# Patient Record
Sex: Female | Born: 1965 | Race: Black or African American | Hispanic: No | Marital: Married | Smoking: Never smoker
Health system: Southern US, Community
[De-identification: ages and names within clinical notes are randomized; demographics above are authoritative.]

## PROBLEM LIST (undated history)

## (undated) DIAGNOSIS — R87619 Unspecified abnormal cytological findings in specimens from cervix uteri: Secondary | ICD-10-CM

## (undated) DIAGNOSIS — D573 Sickle-cell trait: Secondary | ICD-10-CM

## (undated) DIAGNOSIS — M5481 Occipital neuralgia: Secondary | ICD-10-CM

## (undated) DIAGNOSIS — G43909 Migraine, unspecified, not intractable, without status migrainosus: Secondary | ICD-10-CM

## (undated) HISTORY — DX: Occipital neuralgia: M54.81

## (undated) HISTORY — PX: TUBAL LIGATION: SHX77

## (undated) HISTORY — DX: Sickle-cell trait: D57.3

## (undated) HISTORY — PX: CRYOTHERAPY: SHX1416

## (undated) HISTORY — DX: Unspecified abnormal cytological findings in specimens from cervix uteri: R87.619

---

## 1998-02-17 ENCOUNTER — Emergency Department (HOSPITAL_COMMUNITY): Admission: EM | Admit: 1998-02-17 | Discharge: 1998-02-17 | Payer: Self-pay | Admitting: Emergency Medicine

## 1998-06-05 ENCOUNTER — Emergency Department (HOSPITAL_COMMUNITY): Admission: EM | Admit: 1998-06-05 | Discharge: 1998-06-05 | Payer: Self-pay | Admitting: Emergency Medicine

## 1998-07-23 ENCOUNTER — Ambulatory Visit (HOSPITAL_COMMUNITY): Admission: RE | Admit: 1998-07-23 | Discharge: 1998-07-23 | Payer: Self-pay

## 1998-09-14 ENCOUNTER — Encounter: Payer: Self-pay | Admitting: Emergency Medicine

## 1998-09-14 ENCOUNTER — Emergency Department (HOSPITAL_COMMUNITY): Admission: EM | Admit: 1998-09-14 | Discharge: 1998-09-14 | Payer: Self-pay | Admitting: Emergency Medicine

## 1998-10-06 ENCOUNTER — Emergency Department (HOSPITAL_COMMUNITY): Admission: EM | Admit: 1998-10-06 | Discharge: 1998-10-06 | Payer: Self-pay | Admitting: Emergency Medicine

## 1998-10-06 ENCOUNTER — Ambulatory Visit (HOSPITAL_COMMUNITY): Admission: RE | Admit: 1998-10-06 | Discharge: 1998-10-06 | Payer: Self-pay | Admitting: Family Medicine

## 1998-10-06 ENCOUNTER — Encounter: Payer: Self-pay | Admitting: Family Medicine

## 2001-09-01 ENCOUNTER — Emergency Department (HOSPITAL_COMMUNITY): Admission: EM | Admit: 2001-09-01 | Discharge: 2001-09-01 | Payer: Self-pay | Admitting: Emergency Medicine

## 2002-04-23 ENCOUNTER — Emergency Department (HOSPITAL_COMMUNITY): Admission: EM | Admit: 2002-04-23 | Discharge: 2002-04-23 | Payer: Self-pay | Admitting: *Deleted

## 2002-06-05 ENCOUNTER — Emergency Department (HOSPITAL_COMMUNITY): Admission: EM | Admit: 2002-06-05 | Discharge: 2002-06-05 | Payer: Self-pay | Admitting: Emergency Medicine

## 2004-11-20 ENCOUNTER — Emergency Department (HOSPITAL_COMMUNITY): Admission: EM | Admit: 2004-11-20 | Discharge: 2004-11-20 | Payer: Self-pay | Admitting: Emergency Medicine

## 2012-05-25 ENCOUNTER — Encounter (HOSPITAL_COMMUNITY): Payer: Self-pay | Admitting: Family Medicine

## 2012-05-25 ENCOUNTER — Emergency Department (HOSPITAL_COMMUNITY)
Admission: EM | Admit: 2012-05-25 | Discharge: 2012-05-25 | Disposition: A | Discharge status: Home / Self Care | Payer: Self-pay | Attending: Emergency Medicine | Admitting: Emergency Medicine

## 2012-05-25 DIAGNOSIS — G44009 Cluster headache syndrome, unspecified, not intractable: Secondary | ICD-10-CM | POA: Insufficient documentation

## 2012-05-25 HISTORY — DX: Migraine, unspecified, not intractable, without status migrainosus: G43.909

## 2012-05-25 MED ORDER — SUMATRIPTAN SUCCINATE 100 MG PO TABS: 100.0000 mg | ORAL_TABLET | ORAL | Status: DC | PRN

## 2012-05-25 NOTE — ED Provider Notes (Signed)
History     CSN: [id number]  Arrival date & time 05/25/12  0707   First MD Initiated Contact with Patient 05/25/12 [phone]      Chief Complaint  Patient presents with  . Migraine    (Consider location/radiation/quality/duration/timing/severity/associated sxs/prior treatment) Patient is a 46 y.o. female presenting with migraines. The history is provided by the patient.  Migraine This is a recurrent problem. Associated symptoms include headaches. Pertinent negatives include no chest pain, no abdominal pain and no shortness of breath.   patient states that she's been having episodes of headaches. States it is sharp in the back of her head at work his way to the front. She states it is made her tear. No fevers. She states she gets migraines, but this feels different. She states sometimes moving her head to certain positional make it feel better. No trouble hearing. No nausea vomiting or photosensitivity. No head trauma. No visual changes. No neck pain. She states the headaches will come and go. She states that she has one now. She's not tried any medications.  Past Medical History  Diagnosis Date  . Migraine     History reviewed. No pertinent past surgical history.  No family history on file.  History  Substance Use Topics  . Smoking status: Never Smoker   . Smokeless tobacco: Not on file  . Alcohol Use: No    OB History    Grav Para Term Preterm Abortions TAB SAB Ect Mult Living                  Review of Systems  Constitutional: Negative for chills, appetite change and fatigue.  HENT: Negative for neck pain and neck stiffness.   Eyes: Negative for pain and redness.       Tearing of right eye  Respiratory: Negative for shortness of breath.   Cardiovascular: Negative for chest pain.  Gastrointestinal: Negative for abdominal pain.  Genitourinary: Negative for flank pain.  Skin:       Patient states that she gets bumps on her scans after going in a certain room to play she  works.  Neurological: Positive for headaches.  Hematological: Negative for adenopathy.    Allergies  Review of patient's allergies indicates no known allergies.  Home Medications   Current Outpatient Rx  Name Route Sig Dispense Refill  . ASPIRIN-ACETAMINOPHEN-CAFFEINE 250-250-65 MG PO TABS Oral Take 1 tablet by mouth every 6 (six) hours as needed. migraine    . SUMATRIPTAN SUCCINATE 100 MG PO TABS Oral Take 1 tablet (100 mg total) by mouth every 2 (two) hours as needed for migraine. 10 tablet 0    BP 145/82  Pulse 70  Temp 97.8 F (36.6 C) (Oral)  Resp 18  SpO2 100%  LMP 05/25/2012  Physical Exam  Constitutional: She appears well-developed and well-nourished.  HENT:  Head: Normocephalic.       Right TM normal No tenderness to posterior head.  Eyes: Pupils are equal, round, and reactive to light.  Neck: Neck supple.  Cardiovascular: Normal rate.   Pulmonary/Chest: Effort normal.  Abdominal: Soft.  Musculoskeletal: Normal range of motion.  Neurological: She is alert.  Skin: Skin is warm.    ED Course  Procedures (including critical care time)  Labs Reviewed - No data to display No results found.   1. Cluster headache       MDM  Patient with headache and some tearing of the right eye. She's been having episodes of it is likely a cluster  headache. She feels better after his flow oxygen. She be discharged home with some Imitrex and will followup with neurology as needed.        Juliet Rude. Rubin Payor, MD 05/25/12 1609

## 2012-05-25 NOTE — ED Notes (Signed)
Patient states that she has had a migraine since yesterday. Also c/o pressure in the base of her head on the right. Has not had any meds. Denies nausea, vomiting, or photosensitivity.

## 2012-10-07 ENCOUNTER — Encounter: Payer: Self-pay | Admitting: Nurse Practitioner

## 2012-10-07 DIAGNOSIS — M5481 Occipital neuralgia: Secondary | ICD-10-CM | POA: Insufficient documentation

## 2012-11-06 ENCOUNTER — Encounter: Payer: Self-pay | Admitting: Nurse Practitioner

## 2012-11-06 ENCOUNTER — Ambulatory Visit (INDEPENDENT_AMBULATORY_CARE_PROVIDER_SITE_OTHER): Payer: BC Managed Care – PPO | Admitting: Nurse Practitioner

## 2012-11-06 VITALS — BP 108/63 | HR 82 | Ht 67.0 in | Wt 163.0 lb

## 2012-11-06 DIAGNOSIS — G43909 Migraine, unspecified, not intractable, without status migrainosus: Secondary | ICD-10-CM | POA: Insufficient documentation

## 2012-11-06 DIAGNOSIS — M531 Cervicobrachial syndrome: Secondary | ICD-10-CM

## 2012-11-06 DIAGNOSIS — M5481 Occipital neuralgia: Secondary | ICD-10-CM

## 2012-11-06 MED ORDER — GABAPENTIN 100 MG PO CAPS: 100.0000 mg | ORAL_CAPSULE | Freq: Every day | ORAL | Status: DC

## 2012-11-06 NOTE — Progress Notes (Signed)
I agree with the treatment plan.

## 2012-11-06 NOTE — Patient Instructions (Addendum)
Patient to continue her Imitrex acutely for migraine as well as diclofenac when necessary For her complaints of right occipital neuralgia, we'll try low dose gabapentin 100 mg at bedtime. Watch for drowsiness with this medication in the morning. This can be increased if necessary. Please review the information on migraine triggers, importance of keeping a diary, other environmental factors. Continue to exercise for stress reduction. Followup in 6 months. Your medication will be called in to your pharmacy.

## 2012-11-06 NOTE — Progress Notes (Signed)
HPI: Patient returns for followup after last visit in November 2013 with Dr. Anne Hahn. She has a history of migraine as well as right-sided occipital neuralgia. Headache frequency for migraine 1 time weekly or less.  Has  photophobia, phonophobia, visual disturbances, nausea  but no  vomiting with her migraines.Imitrex and diclofenac help acutely. Right occipital neuralgia comes and goes, the last incident was on Saturday last week. She took diclofenac but it was not helpful.   ROS: See above visit information   Physical Exam General: well developed, well nourished, seated, in no evident distress Head: head normocephalic and atraumatic. Oropharynx benign Neck: supple with no carotid or supraclavicular bruits Cardiovascular: regular rate and rhythm, no murmurs  Neurologic Exam Mental Status: Awake and fully alert. Oriented to place and time. Recent and remote memory intact. Attention span, concentration and fund of knowledge appropriate. Mood and affect appropriate.  Cranial Nerves: Pupils equal, briskly reactive to light. Extraocular movements full without nystagmus. Visual fields full to confrontation. Hearing intact and symmetric to finger snap. Facial sensation intact. Face, tongue, palate move normally and symmetrically. Neck flexion and extension normal.  Motor: Normal bulk and tone. Normal strength in all tested extremity muscles. Sensory.: intact to touch and pinprick and vibratory.  Coordination: Rapid alternating movements normal in all extremities. Finger-to-nose and heel-to-shin performed accurately bilaterally. Gait and Station: Arises from chair without difficulty. Stance is normal. Gait demonstrates normal stride length and balance . Able to heel, toe and tandem walk without difficulty.  Reflexes: 2+ and symmetric. Toes downgoing.     ASSESSMENT: Migraines in good control with Imitrex and diclofenac. Frequency is 1 or less per week. 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     PLAN: Given patient information on common migraine triggers to include environmental factors, foods, et Karie Soda. Patient was also given information on stress reduction, importance of keeping a diary if headache frequency increases. Will try low dose gabapentin 100 mg at bedtime only  for her occipital neuralgia. May increase at a later date. Patient to  followup in 6 months.    Nilda Riggs, GNP-BC APRN

## 2013-05-07 ENCOUNTER — Encounter (HOSPITAL_COMMUNITY): Payer: Self-pay

## 2013-05-07 ENCOUNTER — Emergency Department (HOSPITAL_COMMUNITY)
Admission: EM | Admit: 2013-05-07 | Discharge: 2013-05-07 | Disposition: A | Discharge status: Home / Self Care | Payer: BC Managed Care – PPO | Attending: Emergency Medicine | Admitting: Emergency Medicine

## 2013-05-07 DIAGNOSIS — Z8669 Personal history of other diseases of the nervous system and sense organs: Secondary | ICD-10-CM | POA: Insufficient documentation

## 2013-05-07 DIAGNOSIS — G43909 Migraine, unspecified, not intractable, without status migrainosus: Secondary | ICD-10-CM | POA: Insufficient documentation

## 2013-05-07 DIAGNOSIS — Z862 Personal history of diseases of the blood and blood-forming organs and certain disorders involving the immune mechanism: Secondary | ICD-10-CM | POA: Insufficient documentation

## 2013-05-07 DIAGNOSIS — Z3202 Encounter for pregnancy test, result negative: Secondary | ICD-10-CM | POA: Insufficient documentation

## 2013-05-07 DIAGNOSIS — A599 Trichomoniasis, unspecified: Secondary | ICD-10-CM | POA: Insufficient documentation

## 2013-05-07 DIAGNOSIS — N390 Urinary tract infection, site not specified: Secondary | ICD-10-CM | POA: Insufficient documentation

## 2013-05-07 LAB — POCT I-STAT, CHEM 8
BUN: 15 mg/dL (ref 6–23)
Chloride: 102 mEq/L (ref 96–112)
Hemoglobin: 14.6 g/dL (ref 12.0–15.0)
Sodium: 141 mEq/L (ref 135–145)
TCO2: 29 mmol/L (ref 0–100)

## 2013-05-07 LAB — URINALYSIS, ROUTINE W REFLEX MICROSCOPIC
Glucose, UA: NEGATIVE mg/dL
Protein, ur: NEGATIVE mg/dL
Specific Gravity, Urine: 1.027 (ref 1.005–1.030)
Urobilinogen, UA: 1 mg/dL (ref 0.0–1.0)
pH: 6 (ref 5.0–8.0)

## 2013-05-07 LAB — CBC WITH DIFFERENTIAL/PLATELET
Basophils Absolute: 0 10*3/uL (ref 0.0–0.1)
Eosinophils Absolute: 0.1 10*3/uL (ref 0.0–0.7)
Hemoglobin: 13 g/dL (ref 12.0–15.0)
Lymphocytes Relative: 42 % (ref 12–46)
Lymphs Abs: 3.6 10*3/uL (ref 0.7–4.0)
MCHC: 35.7 g/dL (ref 30.0–36.0)
Monocytes Relative: 6 % (ref 3–12)
Neutro Abs: 4.4 10*3/uL (ref 1.7–7.7)
Neutrophils Relative %: 51 % (ref 43–77)
Platelets: 226 10*3/uL (ref 150–400)
RBC: 4.56 MIL/uL (ref 3.87–5.11)

## 2013-05-07 LAB — URINE MICROSCOPIC-ADD ON

## 2013-05-07 LAB — WET PREP, GENITAL

## 2013-05-07 LAB — POCT PREGNANCY, URINE: Preg Test, Ur: NEGATIVE

## 2013-05-07 MED ORDER — METRONIDAZOLE 500 MG PO TABS: 500.0000 mg | ORAL_TABLET | Freq: Two times a day (BID) | ORAL | Status: DC

## 2013-05-07 MED ORDER — NITROFURANTOIN MONOHYD MACRO 100 MG PO CAPS: 100.0000 mg | ORAL_CAPSULE | Freq: Two times a day (BID) | ORAL | Status: DC

## 2013-05-07 NOTE — ED Notes (Signed)
Pt c/o abdominal swelling, irregular menstral cycle since December, states have had a tubal.

## 2013-05-07 NOTE — ED Provider Notes (Signed)
CSN: [id number]     Arrival date & time 05/07/13  1653 History   First MD Initiated Contact with Patient 05/07/13 1934     Chief Complaint  Patient presents with  . Abdominal Pain   (Consider location/radiation/quality/duration/timing/severity/associated sxs/prior Treatment) HPI Comments: Patient presenting with a chief complaint of intermittent generalized abdominal pain that has been occurring intermittently over the past few months.  She describes the pain as a cramping pain, but reports that occasionally it feels sharp.  She reports that the pain is very mild at this time.  She reports that she felt that her abdomen was more distended over the past couple of months, but distension improved when she started her menstrual cycle last week.  She reports that her menstrual cycle has been irregular over the past 9 months.  She denies any vaginal bleeding at this time.  She denies nausea, vomiting, diarrhea, constipation, fever, or chills.  Denies vaginal discharge.  Denies urinary symptoms.  She is sexually active.  She has had a tubal ligation in the past.  The history is provided by the patient.    Past Medical History  Diagnosis Date  . Migraine   . Occipital neuralgia   . Sickle cell trait    Past Surgical History  Procedure Laterality Date  . Tubal ligation     Family History  Problem Relation Age of Onset  . Diabetes Mother   . Diabetes Sister   . Healthy Brother    History  Substance Use Topics  . Smoking status: Never Smoker   . Smokeless tobacco: Never Used  . Alcohol Use: No   OB History   Grav Para Term Preterm Abortions TAB SAB Ect Mult Living                 Review of Systems  Gastrointestinal: Positive for abdominal pain.  All other systems reviewed and are negative.    Allergies  Review of patient's allergies indicates no known allergies.  Home Medications   Current Outpatient Rx  Name  Route  Sig  Dispense  Refill  . Aspirin-Acetaminophen-Caffeine  (GOODY HEADACHE PO)   Oral   Take 1 packet by mouth every 6 (six) hours as needed (pain).         . SUMAtriptan (IMITREX) 100 MG tablet   Oral   Take 1 tablet (100 mg total) by mouth every 2 (two) hours as needed for migraine.   10 tablet   0    BP 129/79  Pulse 90  Temp(Src) 98.6 F (37 C) (Oral)  SpO2 96% Physical Exam  Nursing note and vitals reviewed. Constitutional: She appears well-developed and well-nourished.  HENT:  Head: Normocephalic and atraumatic.  Mouth/Throat: Oropharynx is clear and moist.  Neck: Normal range of motion. Neck supple.  Cardiovascular: Normal rate, regular rhythm and normal heart sounds.   Pulmonary/Chest: Effort normal and breath sounds normal.  Abdominal: Soft. Bowel sounds are normal. She exhibits no distension and no mass. There is no rebound and no guarding.  Mild diffuse abdominal pain  Genitourinary: Cervix exhibits no motion tenderness. Right adnexum displays no mass, no tenderness and no fullness. Left adnexum displays no mass, no tenderness and no fullness.  Neurological: She is alert.  Skin: Skin is warm and dry.  Psychiatric: She has a normal mood and affect.    ED Course  Procedures (including critical care time) Labs Review Labs Reviewed  URINALYSIS, ROUTINE W REFLEX MICROSCOPIC - Abnormal; Notable for the following:  APPearance CLOUDY (*)    Hgb urine dipstick LARGE (*)    Leukocytes, UA LARGE (*)    All other components within normal limits  URINE MICROSCOPIC-ADD ON - Abnormal; Notable for the following:    Squamous Epithelial / LPF FEW (*)    Bacteria, UA MANY (*)    All other components within normal limits  POCT I-STAT, CHEM 8 - Abnormal; Notable for the following:    Creatinine, Ser 1.20 (*)    Calcium, Ion 1.28 (*)    All other components within normal limits  URINE CULTURE  GC/CHLAMYDIA PROBE AMP  WET PREP, GENITAL  CBC WITH DIFFERENTIAL  POCT PREGNANCY, URINE   Imaging Review No results found.  MDM    1. Trichomoniasis   2. UTI (urinary tract infection)    Patient presenting with a chief complaint of intermittent generalized abdominal pain.  On exam, mild generalized abdominal pain.  No rebound or guarding.  No pain with pelvic exam.  Patient is afebrile.  UA showing UTI.  Wet prep showing Trichomoniasis.  Remainder labs unremarkable.  Patient treated with Flagyl and Macrobid for UTI.  Urine cultured.  Patient declined prophylactic treatment for Gonorrhea and Chlamydia.  She states that she will just wait until the results come back.  GC/Chlamydia pending.    Pascal Lux Crawford, PA-C 05/08/13 2339

## 2013-05-07 NOTE — Progress Notes (Signed)
Patient confirms she does not have a pcp. EDCM instructed patient to call the phone number on the back of her insurance card or go to insurance company's website to help her find a physician who is close to her and within network.  Patient verbalized understanding.  No further needs at this time.

## 2013-05-08 LAB — GC/CHLAMYDIA PROBE AMP
CT Probe RNA: NEGATIVE
GC Probe RNA: NEGATIVE

## 2013-05-09 LAB — URINE CULTURE: Colony Count: >=100000

## 2013-05-09 NOTE — ED Provider Notes (Signed)
Medical screening examination/treatment/procedure(s) were performed by non-physician practitioner and as supervising physician I was immediately available for consultation/collaboration.  Courtney F Horton, MD 05/09/13 1415 

## 2014-06-19 ENCOUNTER — Other Ambulatory Visit: Payer: Self-pay | Admitting: Obstetrics & Gynecology

## 2014-06-19 DIAGNOSIS — Z1231 Encounter for screening mammogram for malignant neoplasm of breast: Secondary | ICD-10-CM

## 2014-07-05 ENCOUNTER — Ambulatory Visit (HOSPITAL_COMMUNITY)
Admission: RE | Admit: 2014-07-05 | Discharge: 2014-07-05 | Disposition: A | Discharge status: Home / Self Care | Payer: BC Managed Care – PPO | Source: Ambulatory Visit | Attending: Obstetrics & Gynecology | Admitting: Obstetrics & Gynecology

## 2014-07-05 DIAGNOSIS — Z1231 Encounter for screening mammogram for malignant neoplasm of breast: Secondary | ICD-10-CM | POA: Insufficient documentation

## 2014-07-31 ENCOUNTER — Ambulatory Visit (INDEPENDENT_AMBULATORY_CARE_PROVIDER_SITE_OTHER): Payer: BC Managed Care – PPO | Admitting: Certified Nurse Midwife

## 2014-07-31 ENCOUNTER — Encounter: Payer: Self-pay | Admitting: Certified Nurse Midwife

## 2014-07-31 VITALS — BP 100/66 | HR 68 | Resp 16 | Ht 65.5 in | Wt 158.0 lb

## 2014-07-31 DIAGNOSIS — Z01419 Encounter for gynecological examination (general) (routine) without abnormal findings: Secondary | ICD-10-CM

## 2014-07-31 DIAGNOSIS — Z0000 Encounter for general adult medical examination without abnormal findings: Secondary | ICD-10-CM

## 2014-07-31 DIAGNOSIS — N852 Hypertrophy of uterus: Secondary | ICD-10-CM

## 2014-07-31 DIAGNOSIS — Z124 Encounter for screening for malignant neoplasm of cervix: Secondary | ICD-10-CM

## 2014-07-31 DIAGNOSIS — R899 Unspecified abnormal finding in specimens from other organs, systems and tissues: Secondary | ICD-10-CM

## 2014-07-31 DIAGNOSIS — E041 Nontoxic single thyroid nodule: Secondary | ICD-10-CM

## 2014-07-31 LAB — LIPID PANEL
CHOL/HDL RATIO: 4.5 ratio
Cholesterol: 184 mg/dL (ref 0–200)
HDL: 41 mg/dL (ref >39)
LDL Cholesterol: 108 mg/dL — ABNORMAL HIGH (ref 0–99)
Triglycerides: 177 mg/dL — ABNORMAL HIGH (ref <150)
VLDL: 35 mg/dL (ref 0–40)

## 2014-07-31 NOTE — Progress Notes (Signed)
48 y.o. [id number] Single African American Fe here to establish gyn care and  for annual exam. Periods sporadic for the past two years, with period in 1/15 and another 9/15, now having  normal cycles monthly since then. Sexually active,  No STD concerns or testing needed. Patient would like to have lipid screening. Sees PCP prn or urgent care. Has noticed that she has developed a cyst on right wrist, some discomfort with movement, plans to see Orthopedic regarding. No other health issues today.  Patient's last menstrual period was 07/01/2014.          Sexually active: Yes.    The current method of family planning is tubal ligation.    Exercising: Yes.    walk,squats,situps, stretching Smoker:  no  Health Maintenance: Pap:  2014 negative per patient History of abnormal in 1991 with cryo, normal since that time MMG:  07-05-14 density category c, birads category 1:neg Colonoscopy:  none BMD:   none TDaP:  2015 Labs: none Self breast exam: done occ   reports that she has never smoked. She has never used smokeless tobacco. She reports that she does not drink alcohol or use illicit drugs.  Past Medical History  Diagnosis Date  . Migraine     cluster headaches  . Occipital neuralgia   . Sickle cell trait   . Abnormal Pap smear of cervix     yrs ago    Past Surgical History  Procedure Laterality Date  . Tubal ligation    . Cryotherapy      for abnormal paps yrs ago    Current Outpatient Prescriptions  Medication Sig Dispense Refill  . Aspirin-Acetaminophen-Caffeine (GOODY HEADACHE PO) Take 1 packet by mouth every 6 (six) hours as needed (pain).     No current facility-administered medications for this visit.    Family History  Problem Relation Age of Onset  . Diabetes Mother   . Diabetes Sister   . Hypertension Sister     ROS:  Pertinent items are noted in HPI.  Otherwise, a comprehensive ROS was negative.  Exam:   BP 100/66 mmHg  Pulse 68  Resp 16  Ht 5' 5.5" (1.664 m)   Wt 158 lb (71.668 kg)  BMI 25.88 kg/m2  LMP 07/01/2014 Height: 5' 5.5" (166.4 cm)  Ht Readings from Last 3 Encounters:  07/31/14 5' 5.5" (1.664 m)  11/06/12 5\' 7"  (1.702 m)  06/30/12 5' 7.5" (1.715 m)    General appearance: alert, cooperative and appears stated age Head: Normocephalic, without obvious abnormality, atraumatic Neck: no adenopathy, supple, symmetrical, trachea midline and thyroid normal to inspection and palpation, nodule noted on right, non tender  Lungs: clear to auscultation bilaterally Breasts: normal appearance, no masses or tenderness, No nipple retraction or dimpling, No nipple discharge or bleeding, No axillary or supraclavicular adenopathy Heart: regular rate and rhythm Abdomen: soft, non-tender; no masses,  no organomegaly Extremities: extremities normal, atraumatic, no cyanosis or edema Right wrist ? Ganglion cyst noted, non tender Skin: Skin color, texture, turgor normal. No rashes or lesions Lymph nodes: Cervical, supraclavicular, and axillary nodes normal. No abnormal inguinal nodes palpated Neurologic: Grossly normal   Pelvic: External genitalia:  no lesions              Urethra:  normal appearing urethra with no masses, tenderness or lesions              Bartholin's and Skene's: normal  Vagina: normal appearing vagina with normal color and discharge, small 1-2 cm area in entrance of vagina appears to be scar tissue, from childbirth patient says she has had for ever. Not red, non tender, mobile..              Cervix: normal, non tender,no lesions              Pap taken: Yes.   Bimanual Exam:  Uterus:  enlarged, 10-12 weeks size non tender, slightly firm deviates to left              Adnexa: normal adnexa and no mass, fullness, tenderness               Rectovaginal: Confirms               Anus:  normal sphincter tone, no lesions  A:  Well Woman with normal exam  Contraception BTL  History of cluster headaches, manages with OTC with  good results.  Right wrist nodule ? Ganglion cyst,patient plans orthopedic visit for evaluation  Thyroid nodule with mild enlargement  Enlarged uterus ? Fibroid with cycle changes  P:   Reviewed health and wellness pertinent to exam  Reviewed warning signs with cluster headaches and need to seek evaluation  Discussed agree she needs to see orthopedic, offered to schedule, declined  Discussed thyroid finding and need for evaluation with [place] and lab work. Patient agreeable.Will schedule and patient will be called with appointment.  Lab: TSH with panel, Lipid panel  Discussed menstrual cycle change could be related to thyroid or to enlarged uterine finding or just perimenopausal changes. Need to evaluate uterine sizing with PUS, patient agreeable. Will schedule and patient will be called with insurance info and scheduled. Questions addressed regarding finding.  Pap smear taken today with [facility]   counseled on breast self exam, mammography screening, adequate intake of calcium and vitamin D, diet and exercise  return annually or prn  An After Visit Summary was printed and given to the patient.

## 2014-07-31 NOTE — Patient Instructions (Addendum)
EXERCISE AND DIET:  We recommended that you start or continue a regular exercise program for good health. Regular exercise means any activity that makes your heart beat faster and makes you sweat.  We recommend exercising at least 30 minutes per day at least 3 days a week, preferably 4 or 5.  We also recommend a diet low in fat and sugar.  Inactivity, poor dietary choices and obesity can cause diabetes, heart attack, stroke, and kidney damage, among others.    ALCOHOL AND SMOKING:  Women should limit their alcohol intake to no more than 7 drinks/beers/glasses of wine (combined, not each!) per week. Moderation of alcohol intake to this level decreases your risk of breast cancer and liver damage. And of course, no recreational drugs are part of a healthy lifestyle.  And absolutely no smoking or even second hand smoke. Most people know smoking can cause heart and lung diseases, but did you know it also contributes to weakening of your bones? Aging of your skin?  Yellowing of your teeth and nails?  CALCIUM AND VITAMIN D:  Adequate intake of calcium and Vitamin D are recommended.  The recommendations for exact amounts of these supplements seem to change often, but generally speaking 600 mg of calcium (either carbonate or citrate) and 800 units of Vitamin D per day seems prudent. Certain women may benefit from higher intake of Vitamin D.  If you are among these women, your doctor will have told you during your visit.    PAP SMEARS:  Pap smears, to check for cervical cancer or precancers,  have traditionally been done yearly, although recent scientific advances have shown that most women can have pap smears less often.  However, every woman still should have a physical exam from her gynecologist every year. It will include a breast check, inspection of the vulva and vagina to check for abnormal growths or skin changes, a visual exam of the cervix, and then an exam to evaluate the size and shape of the uterus and  ovaries.  And after 46 years of age, a rectal exam is indicated to check for rectal cancers. We will also provide age appropriate advice regarding health maintenance, like when you should have certain vaccines, screening for sexually transmitted diseases, bone density testing, colonoscopy, mammograms, etc.   MAMMOGRAMS:  All women over 40 years old should have a yearly mammogram. Many facilities now offer a "3D" mammogram, which may cost around $50 extra out of pocket. If possible,  we recommend you accept the option to have the 3D mammogram performed.  It both reduces the number of women who will be called back for extra views which then turn out to be normal, and it is better than the routine mammogram at detecting truly abnormal areas.    COLONOSCOPY:  Colonoscopy to screen for colon cancer is recommended for all women at age 50.  We know, you hate the idea of the prep.  We agree, BUT, having colon cancer and not knowing it is worse!!  Colon cancer so often starts as a polyp that can be seen and removed at colonscopy, which can quite literally save your life!  And if your first colonoscopy is normal and you have no family history of colon cancer, most women don't have to have it again for 10 years.  Once every ten years, you can do something that may end up saving your life, right?  We will be happy to help you get it scheduled when you are ready.    Be sure to check your insurance coverage so you understand how much it will cost.  It may be covered as a preventative service at no cost, but you should check your particular policy.     Fibroids Fibroids are lumps (tumors) that can occur any place in a woman's body. These lumps are not cancerous. Fibroids vary in size, weight, and where they grow. HOME CARE  Do not take aspirin.  Write down the number of pads or tampons you use during your period. Tell your doctor. This can help determine the best treatment for you. GET HELP RIGHT AWAY IF:  You have  pain in your lower belly (abdomen) that is not helped with medicine.  You have cramps that are not helped with medicine.  You have more bleeding between or during your period.  You feel lightheaded or pass out (faint).  Your lower belly pain gets worse. MAKE SURE YOU:  Understand these instructions.  Will watch your condition.  Will get help right away if you are not doing well or get worse. Document Released: 09/04/2010 Document Revised: 10/25/2011 Document Reviewed: 09/04/2010 [facility] Patient Information 2015 [place], [place]. This information is not intended to replace advice given to you by your health care provider. Make sure you discuss any questions you have with your health care provider.

## 2014-08-01 ENCOUNTER — Telehealth: Payer: Self-pay

## 2014-08-01 LAB — CBC
HCT: 38.9 % (ref 36.0–46.0)
HEMOGLOBIN: 13.3 g/dL (ref 12.0–15.0)
MCH: 28 pg (ref 26.0–34.0)
MCHC: 34.2 g/dL (ref 30.0–36.0)
MCV: 81.9 fL (ref 78.0–100.0)
MPV: 10.5 fL (ref 9.4–12.4)
PLATELETS: 177 10*3/uL (ref 150–400)
RBC: 4.75 MIL/uL (ref 3.87–5.11)
RDW: 15.4 % (ref 11.5–15.5)
WBC: 7.1 10*3/uL (ref 4.0–10.5)

## 2014-08-01 LAB — THYROID PANEL WITH TSH
Free Thyroxine Index: 1.6 (ref 1.4–3.8)
T3 UPTAKE: 25 % (ref 22–35)
T4 TOTAL: 6.3 ug/dL (ref 4.5–12.0)
TSH: 1.056 u[IU]/mL (ref 0.350–4.500)

## 2014-08-01 NOTE — Telephone Encounter (Signed)
-----   Message from Regina Eck, CNM sent at 08/01/2014  8:13 AM EST ----- Notify patient that Thyroid panel with TSH is normal Lipid panel is normal except for triglycerides normal is 150 yours 177 work on decreasing fatty fried foods and decrease concentrated sugary foods. Repeat level one month fasting order in CBC normal.

## 2014-08-01 NOTE — Telephone Encounter (Signed)
[email or web address]

## 2014-08-01 NOTE — Telephone Encounter (Signed)
Pt returned call

## 2014-08-01 NOTE — Telephone Encounter (Signed)
Patient notified of results. See lab 

## 2014-08-02 LAB — IPS PAP TEST WITH HPV

## 2014-08-02 NOTE — Progress Notes (Signed)
Reviewed personally.  M. Suzanne Damyiah Moxley, MD.  

## 2014-08-07 ENCOUNTER — Ambulatory Visit
Admission: RE | Admit: 2014-08-07 | Discharge: 2014-08-07 | Disposition: A | Discharge status: Home / Self Care | Payer: BC Managed Care – PPO | Source: Ambulatory Visit | Attending: Certified Nurse Midwife | Admitting: Certified Nurse Midwife

## 2014-08-07 DIAGNOSIS — E041 Nontoxic single thyroid nodule: Secondary | ICD-10-CM

## 2014-08-10 ENCOUNTER — Emergency Department (HOSPITAL_COMMUNITY)
Admission: EM | Admit: 2014-08-10 | Discharge: 2014-08-10 | Disposition: A | Discharge status: Home / Self Care | Payer: BC Managed Care – PPO | Attending: Emergency Medicine | Admitting: Emergency Medicine

## 2014-08-10 ENCOUNTER — Emergency Department (HOSPITAL_COMMUNITY): Payer: BC Managed Care – PPO

## 2014-08-10 ENCOUNTER — Encounter (HOSPITAL_COMMUNITY): Payer: Self-pay | Admitting: Emergency Medicine

## 2014-08-10 DIAGNOSIS — Z862 Personal history of diseases of the blood and blood-forming organs and certain disorders involving the immune mechanism: Secondary | ICD-10-CM | POA: Diagnosis not present

## 2014-08-10 DIAGNOSIS — R519 Headache, unspecified: Secondary | ICD-10-CM

## 2014-08-10 DIAGNOSIS — R51 Headache: Secondary | ICD-10-CM | POA: Diagnosis present

## 2014-08-10 DIAGNOSIS — G43909 Migraine, unspecified, not intractable, without status migrainosus: Secondary | ICD-10-CM | POA: Insufficient documentation

## 2014-08-10 DIAGNOSIS — Z8739 Personal history of other diseases of the musculoskeletal system and connective tissue: Secondary | ICD-10-CM | POA: Diagnosis not present

## 2014-08-10 MED ORDER — SODIUM CHLORIDE 0.9 % IV BOLUS (SEPSIS)
1000.0000 mL | Freq: Once | INTRAVENOUS | Status: AC
  Administered 2014-08-10: 1000 mL via INTRAVENOUS

## 2014-08-10 MED ORDER — MORPHINE SULFATE 4 MG/ML IJ SOLN
6.0000 mg | Freq: Once | INTRAMUSCULAR | Status: AC
  Administered 2014-08-10: 6 mg via INTRAVENOUS
  Filled 2014-08-10: qty 2

## 2014-08-10 MED ORDER — ONDANSETRON HCL 4 MG/2ML IJ SOLN
4.0000 mg | Freq: Once | INTRAMUSCULAR | Status: AC
  Administered 2014-08-10: 4 mg via INTRAVENOUS
  Filled 2014-08-10: qty 2

## 2014-08-10 MED ORDER — HYDROCODONE-ACETAMINOPHEN 5-325 MG PO TABS: 1.0000 | ORAL_TABLET | ORAL | Status: DC | PRN

## 2014-08-10 MED ORDER — KETOROLAC TROMETHAMINE 30 MG/ML IJ SOLN
30.0000 mg | Freq: Once | INTRAMUSCULAR | Status: AC
  Administered 2014-08-10: 30 mg via INTRAVENOUS
  Filled 2014-08-10: qty 1

## 2014-08-10 MED ORDER — ONDANSETRON 8 MG PO TBDP: 8.0000 mg | ORAL_TABLET | Freq: Three times a day (TID) | ORAL | Status: DC | PRN

## 2014-08-10 MED ORDER — METOCLOPRAMIDE HCL 5 MG/ML IJ SOLN
10.0000 mg | Freq: Once | INTRAMUSCULAR | Status: AC
  Administered 2014-08-10: 10 mg via INTRAVENOUS
  Filled 2014-08-10: qty 2

## 2014-08-10 NOTE — ED Provider Notes (Signed)
CSN: [id number]     Arrival date & time 08/10/14  1009 History   First MD Initiated Contact with Patient 08/10/14 1020     Chief Complaint  Patient presents with  . Headache  . Nausea    HPI Patient reports a long-standing history of intermittent migraines.  She reports she woke up this morning had a severe headache with associated nausea.  Denies abdominal pain.  Denies vomiting.  She's not tried any medication prior to arrival.  No fevers or chills.  Denies neck pain or neck stiffness.  No altered mental status.  No change in her vision.  Symptoms are mild to moderate in severity.  Worse with light.  Nothing improves her symptoms.   Past Medical History  Diagnosis Date  . Migraine     cluster headaches  . Occipital neuralgia   . Sickle cell trait   . Abnormal Pap smear of cervix     yrs ago   Past Surgical History  Procedure Laterality Date  . Tubal ligation    . Cryotherapy      for abnormal paps yrs ago   Family History  Problem Relation Age of Onset  . Diabetes Mother   . Diabetes Sister   . Hypertension Sister    History  Substance Use Topics  . Smoking status: Never Smoker   . Smokeless tobacco: Never Used  . Alcohol Use: No   OB History    Gravida Para Term Preterm AB TAB SAB Ectopic Multiple Living   4 4 4       8      Review of Systems  All other systems reviewed and are negative.     Allergies  Review of patient's allergies indicates no known allergies.  Home Medications   Prior to Admission medications   Medication Sig Start Date End Date Taking? Authorizing Provider  aspirin-acetaminophen-caffeine (EXCEDRIN MIGRAINE) [phone] MG per tablet Take 2 tablets by mouth every 6 (six) hours as needed for headache.   Yes Historical Provider, MD  Aspirin-Acetaminophen-Caffeine (GOODY HEADACHE PO) Take 1 packet by mouth every 6 (six) hours as needed (pain).   Yes Historical Provider, MD  HYDROcodone-acetaminophen (NORCO/VICODIN) 5-325 MG per tablet Take  1 tablet by mouth every 4 (four) hours as needed for moderate pain. 08/10/14   Hoy Morn, MD  ondansetron (ZOFRAN ODT) 8 MG disintegrating tablet Take 1 tablet (8 mg total) by mouth every 8 (eight) hours as needed for nausea or vomiting. 08/10/14   Hoy Morn, MD   BP 148/87 mmHg  Pulse 67  Temp(Src) 98.5 F (36.9 C) (Oral)  Resp 17  SpO2 100%  LMP 07/01/2014 Physical Exam  Constitutional: She is oriented to person, place, and time. She appears well-developed and well-nourished. No distress.  HENT:  Head: Normocephalic and atraumatic.  Eyes: EOM are normal. Pupils are equal, round, and reactive to light.  Neck: Normal range of motion.  Cardiovascular: Normal rate, regular rhythm and normal heart sounds.   Pulmonary/Chest: Effort normal and breath sounds normal.  Abdominal: Soft. She exhibits no distension. There is no tenderness.  Musculoskeletal: Normal range of motion.  Neurological: She is alert and oriented to person, place, and time.  5/5 strength in major muscle groups of  bilateral upper and lower extremities. Speech normal. No facial asymetry.   Skin: Skin is warm and dry.  Psychiatric: She has a normal mood and affect. Judgment normal.  Nursing note and vitals reviewed.   ED Course  Procedures (including  critical care time) Labs Review Labs Reviewed - No data to display  Imaging Review Ct Head Wo Contrast  08/10/2014   CLINICAL DATA:  Headache this morning with nausea.  EXAM: CT HEAD WITHOUT CONTRAST  TECHNIQUE: Contiguous axial images were obtained from the base of the skull through the vertex without contrast.  COMPARISON:  Head CT report from 09/14/1998  FINDINGS: Normal appearance of the intracranial structures. No evidence for acute hemorrhage, mass lesion, midline shift, hydrocephalus or large infarct. No acute bony abnormality. The visualized sinuses are clear.  IMPRESSION: Negative head CT.   Electronically Signed   By: Markus Daft M.D.   On: 08/10/2014  11:47     EKG Interpretation None      MDM   Final diagnoses:  Headache    1:55 PM Patient feels much better this time.  Likely migraine-like headache.  Primary care neurology follow-up.  She understands to return to the ER for new or worsening symptoms.    Hoy Morn, MD 08/10/14 [phone]

## 2014-08-10 NOTE — ED Notes (Signed)
Pt c/o intermittent headaches, pt states that she woke up this morning with one. Pt states she feels nauseated but denies abd pain.  Pt states that she has cluster headaches.

## 2014-08-10 NOTE — ED Notes (Signed)
Bed: WA08 Expected date:  Expected time:  Means of arrival:  Comments: 

## 2014-08-12 ENCOUNTER — Telehealth: Payer: Self-pay | Admitting: Obstetrics & Gynecology

## 2014-08-12 DIAGNOSIS — N852 Hypertrophy of uterus: Secondary | ICD-10-CM

## 2014-08-12 NOTE — Telephone Encounter (Signed)
Order placed for Dr. Whitney Post.  Patient scheduled for 09/05/14. Routing to provider for final review.  Will close encounter

## 2014-08-12 NOTE — Telephone Encounter (Signed)
Spoke with patient. Advised that per 2016 benefit quote received, she will be responsible to pay $462.38 when she comes in for PUS.  Patient agreeable. Scheduled PUS. Advised patient of the 72 hour cancellation policy and the $643 fee associated. Patient agreeable.

## 2014-08-14 ENCOUNTER — Telehealth: Payer: Self-pay | Admitting: Emergency Medicine

## 2014-08-14 DIAGNOSIS — E079 Disorder of thyroid, unspecified: Secondary | ICD-10-CM

## 2014-08-14 NOTE — Telephone Encounter (Signed)
-----   Message from Regina Eck, CNM sent at 08/13/2014  3:00 PM EST ----- Please notify patient that her thyroid US showed her right thyroid lobe no nodules. Her left thyroid lobe shows a solid thyroid mass that needs to be biopsied.  Please schedule with [facility] imaging, soon.

## 2014-08-14 NOTE — Telephone Encounter (Signed)
-----   Message from Regina Eck, CNM sent at 08/13/2014  3:00 PM EST ----- Please notify patient that her thyroid US showed her right thyroid lobe no nodules. Her left thyroid lobe shows a solid thyroid mass that needs to be biopsied.  Please schedule with [facility] imaging, soon.

## 2014-08-14 NOTE — Telephone Encounter (Signed)
Spoke with patient and message from Regina Eck CNM given.  Patient agrees to schedule thyroid biopsy. Scheduled for 08/21/13 at 1500 at [facility] at [address], [place] [address].  Advised patient would be contacted with results from biopsy. Patient agreeable.   Routing to provider for final review. Patient agreeable to disposition. Will close encounter

## 2014-08-21 ENCOUNTER — Other Ambulatory Visit (HOSPITAL_COMMUNITY)
Admission: RE | Admit: 2014-08-21 | Discharge: 2014-08-21 | Disposition: A | Discharge status: Home / Self Care | Payer: BLUE CROSS/BLUE SHIELD | Source: Ambulatory Visit | Attending: Interventional Radiology | Admitting: Interventional Radiology

## 2014-08-21 ENCOUNTER — Ambulatory Visit
Admission: RE | Admit: 2014-08-21 | Discharge: 2014-08-21 | Disposition: A | Discharge status: Home / Self Care | Payer: BLUE CROSS/BLUE SHIELD | Source: Ambulatory Visit | Attending: Certified Nurse Midwife | Admitting: Certified Nurse Midwife

## 2014-08-21 DIAGNOSIS — E079 Disorder of thyroid, unspecified: Secondary | ICD-10-CM

## 2014-08-28 NOTE — Telephone Encounter (Signed)
Spoke with Dr.Silva who recommends that patient be seen at local ER for evaluation. Spoke with patient. Advised will need to be seen at ER for evaluation of symptoms. Advised of importance as we would not want symptoms to progress and cause difficulty breathing. Patient is agreeable and will be evaluated locally. Patient requesting biopsy results. Advised patient results from 08/21/14 show a benign thyroid nodule. See results in EPIC. Patient asking if she will need any further follow up. Advised Regina Eck CNM will need to review final results upon return tomorrow to make any further recommendations. Patient is agreeable.   Dr.Silva agree with plan?

## 2014-08-28 NOTE — Telephone Encounter (Signed)
Spoke with patient. Had thyroid biopsy on 08/21/14 at [place].Patient states yesterday around 7pm she began to have difficulty swallowing. "It feels like I am having to force food down when I swallow. Some times I am having to cough the food back up because it will not go down. My throat is dry and feels rough on the left side. I have been drinking plenty of water but it is not helping. It feels like my throat is swelling. My coworker told me today that it looks swollen from the outside too." Denies any difficulty breathing. Advised I will need to speak with MD in our office as well as [place] and return call with follow up recommendations. Patient is agreeable. Spoke with Dr[facility]Henn at [facility]. Explained patient's side effects and symptoms. Dr.Henn states these symptoms are highly unlikely related to the biopsy. States thyroid hematoma can occur 24 hours after biopsy as it has been one week does not feel this is related. "She may be feeling th nodule. You can order a thyroid ultrasound to recheck the area if needed. The chances of it being related are very low." Advised will speak with Dr.Silva in our office to discuss how to proceed. Dr.Henn is agreeable.

## 2014-08-28 NOTE — Telephone Encounter (Signed)
Had thyroid biopsy last Wednesday. Pt states when she eats she feels like food stuck in her throat making it hard to swallow at times. States she was told she would have some swelling but thinks it should have come down by now. Dry mouth. Drinking lots of fluids but still has stuck feeling.  [phone]

## 2014-08-28 NOTE — Telephone Encounter (Signed)
I agree that the patient will need to be seen in the emergency department for evaluation.    Please keep patient's phone note in your box and contact her tomorrow to receive follow up information about her trip to the emergency department.  We can then make a referral to endocrinology for follow up of the nodule.  Dr. Soyla Murphy would be a good choice.  Cc - Evalee Mutton

## 2014-08-29 NOTE — Telephone Encounter (Signed)
Left message to call Daniesha Driver at [phone]. 

## 2014-08-29 NOTE — Telephone Encounter (Signed)
Spoke with patient. Patient states that she did not seek care at Urgent care or the ER. "Throughout the day it got better. I am okay now. I don't know what it was. Maybe I was allergic to something." Advised patient of message as seen below from [place]. Patient would like to see Dr.Balan for follow up. Referral placed. Advised Dr.Balan's office or our referral's coordinator will be in contact with her regarding appointment. Patient is agreeable. Advised if at any time symptoms return will need to seek evaluation. Patient is agreeable.  Cc: Regina Eck CNM   Routing to provider for final review. Patient agreeable to disposition. Will close encounter

## 2014-09-02 ENCOUNTER — Telehealth: Payer: Self-pay | Admitting: Obstetrics & Gynecology

## 2014-09-02 ENCOUNTER — Other Ambulatory Visit (INDEPENDENT_AMBULATORY_CARE_PROVIDER_SITE_OTHER): Payer: BLUE CROSS/BLUE SHIELD

## 2014-09-02 ENCOUNTER — Telehealth: Payer: Self-pay | Admitting: Certified Nurse Midwife

## 2014-09-02 DIAGNOSIS — R899 Unspecified abnormal finding in specimens from other organs, systems and tissues: Secondary | ICD-10-CM

## 2014-09-02 DIAGNOSIS — E079 Disorder of thyroid, unspecified: Secondary | ICD-10-CM

## 2014-09-02 LAB — TRIGLYCERIDES: TRIGLYCERIDES: 119 mg/dL (ref <150)

## 2014-09-02 NOTE — Telephone Encounter (Signed)
Patient would like to rs upcoming PUS/consult  appt. 09/05/14. Patient last seen 09/02/14.

## 2014-09-02 NOTE — Telephone Encounter (Signed)
Received appointment fax from [facility] regarding pts appointment with Dr Chalmers Cater on 09/03/14 @10am . Pt states it wasn't enough notice for work and rescheduled appointment for 10/15/14. Pt is concerned at how long it will be for an appointment and would like to make sure the reason she was referred is something that can wait that long.   Pt is also ready to reschedule [place] appointment.

## 2014-09-02 NOTE — Telephone Encounter (Signed)
We can refer to another endocrine if it is going to be that long.

## 2014-09-03 NOTE — Telephone Encounter (Signed)
Spoke with patient. She wants to reschedule PUS. Is at work right now, will call me back when she is near a calendar she will call back to schedule.

## 2014-09-03 NOTE — Telephone Encounter (Signed)
Spoke with patient.  Advised referral to specialist is indicated because thyroid mass is large. Ultrasound and biopsy done, so needs evaluation for management of the mass. Patient agreeable. Accepts referral to [facility] Endocrinology for Dr. Loanne Drilling for this Friday at 09/06/14 at   [place]. Phone given.   Called Dr. Almetta Lovely office and left message for New patient coordinator, [place] to please cancel patient's appointment with Dr. Chalmers Cater.   Routing to provider for final review. Patient agreeable to disposition. Will close encounter   cc Felipa Emory

## 2014-09-05 ENCOUNTER — Other Ambulatory Visit: Payer: BC Managed Care – PPO | Admitting: Obstetrics & Gynecology

## 2014-09-05 ENCOUNTER — Other Ambulatory Visit: Payer: BC Managed Care – PPO

## 2014-09-06 ENCOUNTER — Ambulatory Visit: Payer: BLUE CROSS/BLUE SHIELD | Admitting: Endocrinology

## 2014-09-16 ENCOUNTER — Ambulatory Visit (INDEPENDENT_AMBULATORY_CARE_PROVIDER_SITE_OTHER): Payer: BLUE CROSS/BLUE SHIELD | Admitting: Endocrinology

## 2014-09-16 ENCOUNTER — Encounter: Payer: Self-pay | Admitting: Endocrinology

## 2014-09-16 VITALS — BP 108/66 | HR 90 | Temp 98.1°F | Ht 65.5 in | Wt 158.0 lb

## 2014-09-16 DIAGNOSIS — R1310 Dysphagia, unspecified: Secondary | ICD-10-CM | POA: Insufficient documentation

## 2014-09-16 NOTE — Progress Notes (Signed)
Subjective:    Patient ID: Jenny Obrien, female    DOB: Mar 28, 1966, 49 y.o.   MRN: [id number]  HPI Pt states few mos of slight swelling in the anterior neck, but no assoc pain.   Past Medical History  Diagnosis Date  . Migraine     cluster headaches  . Occipital neuralgia   . Sickle cell trait   . Abnormal Pap smear of cervix     yrs ago    Past Surgical History  Procedure Laterality Date  . Tubal ligation    . Cryotherapy      for abnormal paps yrs ago    History   Social History  . Marital Status: Single    Spouse Name: N/A    Number of Children: N/A  . Years of Education: N/A   Occupational History  . Not on file.   Social History Main Topics  . Smoking status: Never Smoker   . Smokeless tobacco: Never Used  . Alcohol Use: No  . Drug Use: No  . Sexual Activity:    Partners: Male    Birth Control/ Protection: Surgical     Comment: BTL   Other Topics Concern  . Not on file   Social History Narrative    Current Outpatient Prescriptions on File Prior to Visit  Medication Sig Dispense Refill  . aspirin-acetaminophen-caffeine (EXCEDRIN MIGRAINE) 250-250-65 MG per tablet Take 2 tablets by mouth every 6 (six) hours as needed for headache.    . Aspirin-Acetaminophen-Caffeine (GOODY HEADACHE PO) Take 1 packet by mouth every 6 (six) hours as needed (pain).    Marland Kitchen HYDROcodone-acetaminophen (NORCO/VICODIN) 5-325 MG per tablet Take 1 tablet by mouth every 4 (four) hours as needed for moderate pain. (Patient not taking: Reported on 09/16/2014) 12 tablet 0  . ondansetron (ZOFRAN ODT) 8 MG disintegrating tablet Take 1 tablet (8 mg total) by mouth every 8 (eight) hours as needed for nausea or vomiting. (Patient not taking: Reported on 09/16/2014) 10 tablet 0   No current facility-administered medications on file prior to visit.   No Known Allergies  Family History  Problem Relation Age of Onset  . Diabetes Mother   . Diabetes Sister   . Hypertension Sister     BP 108/66  mmHg  Pulse 90  Temp(Src) 98.1 F (36.7 C) (Oral)  Ht 5' 5.5" (1.664 m)  Wt 158 lb (71.668 kg)  BMI 25.88 kg/m2  SpO2 94%  Review of Systems She has slight solid dysphagia.  Denies weight change    Objective:   Physical Exam VS: see vs page GEN: no distress HEAD: head: no deformity eyes: no periorbital swelling, no proptosis external nose and ears are normal mouth: no lesion seen NECK: the left lobe thyroid lobe mass is easily palpable and mobile.   CHEST WALL: no deformity.   LUNGS:  Clear to auscultation.   CV: reg rate and rhythm, no murmur.   MUSCULOSKELETAL: muscle bulk and strength are grossly normal.  no obvious joint swelling.  gait is normal and steady.  EXTEMITIES: no deformity.  no edema. PULSES: no carotid bruit NEURO:  cn 2-12 grossly intact.   readily moves all 4's.  sensation is intact to touch on all 4's.  SKIN:  Normal texture and temperature.  No rash or suspicious lesion is visible.   NODES:  None palpable at the neck PSYCH: alert, well-oriented.  Does not appear anxious nor depressed.    Lab Results  Component Value Date   TSH 1.056  07/31/2014   T4TOTAL 6.3 07/31/2014   (i reviewed thyroid US report and bx result).      Assessment & Plan:  Thyroid mass, new.  bx is low-risk.   Dysphagia, mild, new.     Patient is advised the following: Patient Instructions  No treatment is needed for the thyroid lump now.  i have requested for you a special swallowing x-ray.  you will receive a phone call, about a day and time for an appointment Please come back for a follow-up appointment in June, when you will likely be due for a repeat ultrasound.   most of the time, a "lumpy thyroid" will eventually become overactive.  this is usually a slow process, happening over the span of many years.

## 2014-09-16 NOTE — Patient Instructions (Addendum)
No treatment is needed for the thyroid lump now.  i have requested for you a special swallowing x-ray.  you will receive a phone call, about a day and time for an appointment Please come back for a follow-up appointment in June, when you will likely be due for a repeat ultrasound.   most of the time, a "lumpy thyroid" will eventually become overactive.  this is usually a slow process, happening over the span of many years.

## 2014-09-17 NOTE — Telephone Encounter (Signed)
Pt has questions for the nurse.

## 2014-09-17 NOTE — Telephone Encounter (Signed)
Spoke with patient. Patient states that she was seen with Dr.Ellison yesterday in regards to thyroid enlargement. "I feel like I do not know any more than what you guys already told me before I went there. He wants to wait to have another ultrasound in June. I do not want to wait until then to see if something is happening. I told him I was having trouble swallowing and I had the make a recommendation to have a test to check it out. I feel like if this is serious enough for me to need follow up I should have more information after that appointment. My aunt sees Dr.Balan and told me I need to go see her. I do not want to keep wasting my money for them to tell me the same thing over and over. I am very concerned and upset." Advised patient would send a message over to Regina Eck CNM for her to review OV with Dr.Ellison in [facility] and return call with further recommendations. Patient is agreeable.

## 2014-09-18 ENCOUNTER — Emergency Department (HOSPITAL_COMMUNITY)
Admission: EM | Admit: 2014-09-18 | Discharge: 2014-09-18 | Disposition: A | Discharge status: Home / Self Care | Payer: BLUE CROSS/BLUE SHIELD | Attending: Emergency Medicine | Admitting: Emergency Medicine

## 2014-09-18 ENCOUNTER — Emergency Department (HOSPITAL_COMMUNITY): Payer: BLUE CROSS/BLUE SHIELD

## 2014-09-18 ENCOUNTER — Encounter (HOSPITAL_COMMUNITY): Payer: Self-pay | Admitting: *Deleted

## 2014-09-18 ENCOUNTER — Telehealth: Payer: Self-pay | Admitting: Endocrinology

## 2014-09-18 ENCOUNTER — Telehealth: Payer: Self-pay | Admitting: Certified Nurse Midwife

## 2014-09-18 DIAGNOSIS — G43909 Migraine, unspecified, not intractable, without status migrainosus: Secondary | ICD-10-CM | POA: Insufficient documentation

## 2014-09-18 DIAGNOSIS — Z8739 Personal history of other diseases of the musculoskeletal system and connective tissue: Secondary | ICD-10-CM | POA: Diagnosis not present

## 2014-09-18 DIAGNOSIS — R1310 Dysphagia, unspecified: Secondary | ICD-10-CM | POA: Diagnosis present

## 2014-09-18 DIAGNOSIS — E079 Disorder of thyroid, unspecified: Secondary | ICD-10-CM | POA: Diagnosis not present

## 2014-09-18 DIAGNOSIS — Z862 Personal history of diseases of the blood and blood-forming organs and certain disorders involving the immune mechanism: Secondary | ICD-10-CM | POA: Diagnosis not present

## 2014-09-18 LAB — CBC WITH DIFFERENTIAL/PLATELET
BASOS ABS: 0 10*3/uL (ref 0.0–0.1)
Basophils Relative: 0 % (ref 0–1)
EOS PCT: 2 % (ref 0–5)
Eosinophils Absolute: 0.1 10*3/uL (ref 0.0–0.7)
HEMATOCRIT: 36.6 % (ref 36.0–46.0)
Hemoglobin: 12.6 g/dL (ref 12.0–15.0)
LYMPHS ABS: 2.7 10*3/uL (ref 0.7–4.0)
Lymphocytes Relative: 57 % — ABNORMAL HIGH (ref 12–46)
MCH: 27.3 pg (ref 26.0–34.0)
MCHC: 34.4 g/dL (ref 30.0–36.0)
MCV: 79.2 fL (ref 78.0–100.0)
MONO ABS: 0.3 10*3/uL (ref 0.1–1.0)
Monocytes Relative: 7 % (ref 3–12)
Neutro Abs: 1.6 10*3/uL — ABNORMAL LOW (ref 1.7–7.7)
Neutrophils Relative %: 34 % — ABNORMAL LOW (ref 43–77)
Platelets: 181 10*3/uL (ref 150–400)
RBC: 4.62 MIL/uL (ref 3.87–5.11)
RDW: 14.8 % (ref 11.5–15.5)
WBC: 4.7 10*3/uL (ref 4.0–10.5)

## 2014-09-18 LAB — I-STAT CHEM 8, ED
BUN: 12 mg/dL (ref 6–23)
CALCIUM ION: 1.15 mmol/L (ref 1.12–1.23)
Chloride: 104 mmol/L (ref 96–112)
Creatinine, Ser: 0.9 mg/dL (ref 0.50–1.10)
Glucose, Bld: 90 mg/dL (ref 70–99)
HEMATOCRIT: 40 % (ref 36.0–46.0)
Hemoglobin: 13.6 g/dL (ref 12.0–15.0)
POTASSIUM: 3.8 mmol/L (ref 3.5–5.1)
Sodium: 139 mmol/L (ref 135–145)
TCO2: 23 mmol/L (ref 0–100)

## 2014-09-18 MED ORDER — SODIUM CHLORIDE 0.9 % IV BOLUS (SEPSIS)
500.0000 mL | Freq: Once | INTRAVENOUS | Status: AC
  Administered 2014-09-18: 500 mL via INTRAVENOUS

## 2014-09-18 MED ORDER — IOHEXOL 300 MG/ML  SOLN
100.0000 mL | Freq: Once | INTRAMUSCULAR | Status: AC | PRN
  Administered 2014-09-18: 100 mL via INTRAVENOUS

## 2014-09-18 NOTE — Telephone Encounter (Signed)
See below and please advise, Thanks!  

## 2014-09-18 NOTE — Telephone Encounter (Signed)
Pt has questions for the nurse.

## 2014-09-18 NOTE — Telephone Encounter (Signed)
Reviewed note from Dr. Loanne Drilling, I feel she was adequately examined. He has scheduled her for a swallowing study to evaluate the issues she is having with swallowing, which will identify any other problem and per note she would be called with appointment time and place. This is the next step with swallowing issues. Thyroid biopsy has been done and is benign, no surgical intervention indicated at this time. Follow up US and follow up office visit 6 months is current protocol for benign findings unless patient has change during this time. Thyroid change is usually very slow, but she needs to notify their office of any change in the interim.

## 2014-09-18 NOTE — Telephone Encounter (Signed)
Spoke with patient. Advised of message as seen below from [place]. Patient is agreeable and verbalizes understanding. "I feel like something is stuck in my throat and I am having a hard time swallowing. When I eat I am getting choked." Advised patient needs follow up with swallow study. Denies any difficulty breathing. Advised patient will need to call Dr.Ellison's office with current symptoms and see what they recommend as they have ordered the swallow study for her. Advised if it increases to causing problems with breathing or is unable to swallow needs to be seen at the ER. Advised follow up on this is very important. Patient is agreeable and will call Dr.Ellison's office to see about scheduling swallow study or be seen in the ER today.

## 2014-09-18 NOTE — ED Notes (Signed)
This RN called Pt's Endo office for clarification and staff reports that the Pt called them this morning c/o having something stuck in her throat.  Pt was directed to come to the ED, if she felt as if she needs to be seen.  The outpatient swallow test has been ordered, but cannot be scheduled yet, due to insurance.

## 2014-09-18 NOTE — Telephone Encounter (Signed)
Pt advised of note below and voiced understanding,

## 2014-09-18 NOTE — Telephone Encounter (Signed)
Spoke with patient who states that she is currently at [facility] Emergency room as recommended by Dr.Elison. Advised patient to stay at [facility] per recommendations for further evaluation of difficulty swallowing. Patient is agreeable.  Routing to provider for final review. Patient agreeable to disposition. Will close encounter

## 2014-09-18 NOTE — Telephone Encounter (Signed)
Agreeable with plan

## 2014-09-18 NOTE — Discharge Instructions (Signed)

## 2014-09-18 NOTE — ED Notes (Signed)
Pt reports at [facility] last night and reports difficulty swallowing post event. Pt reports throat dry and pain at right clavicle point with swallow. Throat clear and no swelling noted. Pt hx of enlarged thyroid.

## 2014-09-18 NOTE — Telephone Encounter (Signed)
The swallowing test is considered to be unsafe to do in an emergency.  Please go to ER

## 2014-09-18 NOTE — ED Provider Notes (Signed)
CSN: [id number]     Arrival date & time 09/18/14  1117 History   First MD Initiated Contact with Patient 09/18/14 1156     Chief Complaint  Patient presents with  . Dysphagia     (Consider location/radiation/quality/duration/timing/severity/associated sxs/prior Treatment) The history is provided by the patient.   patient's had some difficulty swallowing for a few weeks now. Has a known thyroid mass that had no ultrasound and a biopsy done. Reportedly not cancerous. Has been seen by endocrinology. States that today she felt so she had something get stuck again. She states to have difficulty swallowing. She states sometimes she even has trouble swallowing her spit. States as if something got stuck today. No difficulty breathing. She is able to handle her secretions at this time. She states she was supposed to get a swallowing study. States she was told it cannot be done right now because in his emergency and she was told to come to the ER.  Past Medical History  Diagnosis Date  . Migraine     cluster headaches  . Occipital neuralgia   . Sickle cell trait   . Abnormal Pap smear of cervix     yrs ago   Past Surgical History  Procedure Laterality Date  . Tubal ligation    . Cryotherapy      for abnormal paps yrs ago   Family History  Problem Relation Age of Onset  . Diabetes Mother   . Diabetes Sister   . Hypertension Sister    History  Substance Use Topics  . Smoking status: Never Smoker   . Smokeless tobacco: Never Used  . Alcohol Use: No   OB History    Gravida Para Term Preterm AB TAB SAB Ectopic Multiple Living   4 4 4       8      Review of Systems  Constitutional: Negative for activity change and appetite change.  HENT: Positive for drooling and trouble swallowing. Negative for mouth sores, sinus pressure, sore throat and voice change.   Eyes: Negative for pain.  Respiratory: Negative for chest tightness and shortness of breath.   Cardiovascular: Negative for chest  pain and leg swelling.  Gastrointestinal: Negative for nausea, vomiting, abdominal pain and diarrhea.  Genitourinary: Negative for flank pain.  Musculoskeletal: Negative for back pain and neck stiffness.  Neurological: Negative for weakness.      Allergies  Review of patient's allergies indicates no known allergies.  Home Medications   Prior to Admission medications   Medication Sig Start Date End Date Taking? Authorizing Provider  aspirin-acetaminophen-caffeine (EXCEDRIN MIGRAINE) [phone] MG per tablet Take 2 tablets by mouth every 6 (six) hours as needed for headache.   Yes Historical Provider, MD  HYDROcodone-acetaminophen (NORCO/VICODIN) 5-325 MG per tablet Take 1 tablet by mouth every 4 (four) hours as needed for moderate pain. Patient not taking: Reported on 09/16/2014 08/10/14   Hoy Morn, MD  ondansetron (ZOFRAN ODT) 8 MG disintegrating tablet Take 1 tablet (8 mg total) by mouth every 8 (eight) hours as needed for nausea or vomiting. Patient not taking: Reported on 09/16/2014 08/10/14   Hoy Morn, MD   BP 121/73 mmHg  Pulse 88  Temp(Src) 98.3 F (36.8 C) (Oral)  Resp 18  SpO2 99% Physical Exam  Constitutional: She appears well-developed and well-nourished.  HENT:  Left-sided anterior neck mass. Mobile. Some tenderness. No stridor. Posterior pharynx appears normal.  Neck: Neck supple. No tracheal deviation present. Thyromegaly present.  Cardiovascular: Normal rate.  Pulmonary/Chest: Effort normal.  Abdominal: Soft. There is no tenderness.  Musculoskeletal: Normal range of motion.  Neurological: She is alert.  Skin: Skin is warm.    ED Course  Procedures (including critical care time) Labs Review Labs Reviewed  CBC WITH DIFFERENTIAL/PLATELET - Abnormal; Notable for the following:    Neutrophils Relative % 34 (*)    Neutro Abs 1.6 (*)    Lymphocytes Relative 57 (*)    All other components within normal limits  I-STAT CHEM 8, ED    Imaging Review Ct  Soft Tissue Neck W Contrast  09/18/2014   CLINICAL DATA:  Dysphagia. History of 4 cm left thyroid nodule with prior thyroid biopsy on 08/21/2014 demonstrating a benign thyroid nodule.  EXAM: CT NECK WITH CONTRAST  TECHNIQUE: Multidetector CT imaging of the neck was performed using the standard protocol following the bolus administration of intravenous contrast.  CONTRAST:  148mL OMNIPAQUE IOHEXOL 300 MG/ML  SOLN  COMPARISON:  Thyroid ultrasound on 08/07/2014  FINDINGS: Pharynx and larynx: Normal in appearance without soft tissue swelling or mass. The airway is normally patent. No inflammation or abnormal fluid collection is identified.  Salivary glands: Normal and symmetric bilaterally without evidence of masses or abnormal calcifications.  Thyroid: Enlargement of the left lobe is identified compared to the right due to underlying known thyroid lesion. This exerts mild mass effect on the trachea which is minimally deviated to the right. No tracheal stenosis identified.  Lymph nodes: No enlarged lymph nodes are seen throughout the neck.  Vascular: Normally patent opacified vasculature.  Limited intracranial: Normal.  Visualized orbits: Normal.  Mastoids and visualized paranasal sinuses: Normally aerated.  Skeleton: Mild cervical spondylosis present at C4-5.  Upper chest: Unremarkable. The visualized esophagus is unremarkable.  IMPRESSION: Enlargement of the left lobe of the thyroid gland by known thyroid nodule. This causes very mild deviation of the trachea to the right. The rest of the study is unremarkable aside from evidence of mild cervical disc disease at C4-5.   Electronically Signed   By: Aletta Edouard M.D.   On: 09/18/2014 13:42     EKG Interpretation None      MDM   Final diagnoses:  Thyroid mass  Dysphagia    Patient with thyroid mass and dysphagia. Has had reported difficulty swallowing. Able to swallow secretions here. CT scan done and shows some tracheal deviation. I discussed with  general surgery who does not believe that the mass needs emergent surgery patient will follow-up with her endocrinologist. No impending airway or esophageal obstruction.    Jasper Riling. Alvino Chapel, MD 09/18/14 [phone]

## 2014-09-18 NOTE — Telephone Encounter (Signed)
Patient called stating that she feels as if she has something stuck in her throat   She would like a swallow test today; if not she will be going to ER   Please advise patient on what to do   Thank you

## 2014-09-18 NOTE — ED Notes (Signed)
Pt states she has had difficulty swallowing since June due to a thyroid lump. Pt had biopsy performed on Monday and was told she would be contacted the next day to schedule a swallow test. Pt states when she was contacted this morning and told to come to ED to have test performed. Pt denies difficulty breathing.

## 2014-11-15 ENCOUNTER — Other Ambulatory Visit: Payer: Self-pay | Admitting: Family Medicine

## 2014-11-15 DIAGNOSIS — R1310 Dysphagia, unspecified: Secondary | ICD-10-CM

## 2014-11-15 DIAGNOSIS — M25531 Pain in right wrist: Secondary | ICD-10-CM

## 2014-11-20 ENCOUNTER — Ambulatory Visit
Admission: RE | Admit: 2014-11-20 | Discharge: 2014-11-20 | Disposition: A | Discharge status: Home / Self Care | Payer: BLUE CROSS/BLUE SHIELD | Source: Ambulatory Visit | Attending: Family Medicine | Admitting: Family Medicine

## 2014-11-20 ENCOUNTER — Emergency Department (HOSPITAL_COMMUNITY)
Admission: EM | Admit: 2014-11-20 | Discharge: 2014-11-20 | Disposition: A | Discharge status: Home / Self Care | Payer: BLUE CROSS/BLUE SHIELD | Attending: Emergency Medicine | Admitting: Emergency Medicine

## 2014-11-20 ENCOUNTER — Encounter (HOSPITAL_COMMUNITY): Payer: Self-pay

## 2014-11-20 DIAGNOSIS — G43009 Migraine without aura, not intractable, without status migrainosus: Secondary | ICD-10-CM | POA: Insufficient documentation

## 2014-11-20 DIAGNOSIS — Z79899 Other long term (current) drug therapy: Secondary | ICD-10-CM | POA: Insufficient documentation

## 2014-11-20 DIAGNOSIS — M25531 Pain in right wrist: Secondary | ICD-10-CM

## 2014-11-20 DIAGNOSIS — Z8739 Personal history of other diseases of the musculoskeletal system and connective tissue: Secondary | ICD-10-CM | POA: Insufficient documentation

## 2014-11-20 DIAGNOSIS — Z862 Personal history of diseases of the blood and blood-forming organs and certain disorders involving the immune mechanism: Secondary | ICD-10-CM | POA: Diagnosis not present

## 2014-11-20 DIAGNOSIS — R1310 Dysphagia, unspecified: Secondary | ICD-10-CM

## 2014-11-20 DIAGNOSIS — G43909 Migraine, unspecified, not intractable, without status migrainosus: Secondary | ICD-10-CM | POA: Diagnosis present

## 2014-11-20 MED ORDER — SUMATRIPTAN SUCCINATE 6 MG/0.5ML ~~LOC~~ SOLN
6.0000 mg | Freq: Once | SUBCUTANEOUS | Status: AC
  Administered 2014-11-20: 6 mg via SUBCUTANEOUS
  Filled 2014-11-20: qty 0.5

## 2014-11-20 MED ORDER — SUMATRIPTAN SUCCINATE 50 MG PO TABS: 50.0000 mg | ORAL_TABLET | ORAL | Status: AC | PRN

## 2014-11-20 NOTE — ED Provider Notes (Signed)
CSN: [id number]     Arrival date & time 11/20/14  1156 History   First MD Initiated Contact with Patient 11/20/14 1300     Chief Complaint  Patient presents with  . Migraine  . Emesis     (Consider location/radiation/quality/duration/timing/severity/associated sxs/prior Treatment) HPI Jenny Obrien is a 49 y.o. female who presents for evaluation of "migraine headache" which started this morning. The headache is unilateral, left-sided. Headache is associated with nausea and vomiting but no fever, chills, cough, shortness of breath or chest pain. She's not having diarrhea. She's had similar headaches in the past and been told that she had migraines. She does not take prophylactic medication for migraines. Has documentation of other types of headache in her chart, as well. There are no known sick contacts. There are no other known modifying factors.  Past Medical History  Diagnosis Date  . Migraine     cluster headaches  . Occipital neuralgia   . Sickle cell trait   . Abnormal Pap smear of cervix     yrs ago   Past Surgical History  Procedure Laterality Date  . Tubal ligation    . Cryotherapy      for abnormal paps yrs ago   Family History  Problem Relation Age of Onset  . Diabetes Mother   . Diabetes Sister   . Hypertension Sister    History  Substance Use Topics  . Smoking status: Never Smoker   . Smokeless tobacco: Never Used  . Alcohol Use: No   OB History    Gravida Para Term Preterm AB TAB SAB Ectopic Multiple Living   4 4 4       8      Review of Systems  All other systems reviewed and are negative.     Allergies  Review of patient's allergies indicates no known allergies.  Home Medications   Prior to Admission medications   Medication Sig Start Date End Date Taking? Authorizing Provider  HYDROcodone-acetaminophen (NORCO/VICODIN) 5-325 MG per tablet Take 1 tablet by mouth every 4 (four) hours as needed for moderate pain. Patient not taking: Reported on  09/16/2014 08/10/14   Jola Schmidt, MD  ondansetron [facility] ODT) 8 MG disintegrating tablet Take 1 tablet (8 mg total) by mouth every 8 (eight) hours as needed for nausea or vomiting. Patient not taking: Reported on 09/16/2014 08/10/14   Jola Schmidt, MD  SUMAtriptan (IMITREX) 50 MG tablet Take 1 tablet (50 mg total) by mouth every 2 (two) hours as needed for migraine. May repeat in 2 hours if headache persists or recurs. 11/20/14   Daleen Bo, MD   BP 116/74 mmHg  Pulse 89  Temp(Src) 97.6 F (36.4 C) (Oral)  Resp 18  SpO2 99% Physical Exam  Constitutional: She is oriented to person, place, and time. She appears well-developed and well-nourished. No distress.  HENT:  Head: Normocephalic and atraumatic.  Right Ear: External ear normal.  Left Ear: External ear normal.  Eyes: Conjunctivae and EOM are normal. Pupils are equal, round, and reactive to light.  Neck: Normal range of motion and phonation normal. Neck supple.  No meningismus  Cardiovascular: Normal rate, regular rhythm and normal heart sounds.   Pulmonary/Chest: Effort normal and breath sounds normal. She exhibits no bony tenderness.  Abdominal: Soft. There is no tenderness.  Musculoskeletal: Normal range of motion.  Neurological: She is alert and oriented to person, place, and time. No cranial nerve deficit or sensory deficit. She exhibits normal muscle tone. Coordination normal.  Skin:  Skin is warm, dry and intact.  Psychiatric: She has a normal mood and affect. Her behavior is normal. Judgment and thought content normal.  Nursing note and vitals reviewed.   ED Course  Procedures (including critical care time)  Medications  SUMAtriptan (IMITREX) injection 6 mg (6 mg Subcutaneous Given 11/20/14 1325)    Patient Vitals for the past 24 hrs:  BP Temp Temp src Pulse Resp SpO2  11/20/14 1459 116/74 mmHg 97.6 F (36.4 C) Oral 89 18 99 %  11/20/14 1324 116/64 mmHg - - 77 16 96 %  11/20/14 1202 135/91 mmHg 97.5 F (36.4 C) Oral  68 16 100 %    3:02 PM Reevaluation with update and discussion. After initial assessment and treatment, an updated evaluation reveals headache has resolved. Findings discussed with patient and husband, all questions answered.. [place] Review Labs Reviewed - No data to display  Imaging Review Dg Wrist Complete Right  11/20/2014   CLINICAL DATA:  Four 5 month history of right wrist pain ; palpable knot over the distal aspect of the ulna; no known injury  EXAM: RIGHT WRIST - COMPLETE 3+ VIEW  COMPARISON:  None.  FINDINGS: The bones of the right wrist are adequately mineralized. There is no acute or old fracture nor dislocation. Specific attention to the distal ulna reveals no abnormality. The joint spaces are reasonably well maintained. There is no chondrocalcinosis. The soft tissues exhibit no abnormal masses.  IMPRESSION: There is no acute or significant chronic bony abnormality of the right wrist. If the patient's symptoms persist and remain unexplained, MRI may be useful.   Electronically Signed   By: David  Martinique   On: 11/20/2014 09:12   Dg Duanne Limerick W/high Density W/kub  11/20/2014   CLINICAL DATA:  Cervical dysphagia. Intermittent chest pain and burning. Thyroid nodule.  EXAM: UPPER GI SERIES WITH KUB  TECHNIQUE: After obtaining a scout radiograph a routine upper GI series was performed using thin and high density barium.  FLUOROSCOPY TIME:  Radiation Exposure Index (as provided by the fluoroscopic device): 55 dGycm2  If the device does not provide the exposure index:  Fluoroscopy Time (in minutes and seconds):  3 minutes 18 seconds  Number of Acquired Images:  0  COMPARISON:  None.  FINDINGS: The preliminary supine radiograph of the abdomen demonstrates a normal bowel gas pattern and normal appearing bones. The patient swallowed barium without difficulty. Normal primary and secondary esophageal peristalsis with no tertiary peristaltic activity seen. No hypopharyngeal abnormalities. The  esophagus, stomach, duodenal bulb and proximal small bowel have normal appearances. No hiatal hernia, strictures, masses, ulcerations or gastroesophageal reflux were seen. The patient swallowed a 12.5 mm in diameter barium tablet without difficulty. This passed normally through the esophagus and into the stomach.  IMPRESSION: Normal examination.   Electronically Signed   By: Claudie Revering M.D.   On: 11/20/2014 09:56     EKG Interpretation None      MDM   Final diagnoses:  Migraine without aura and without status migrainosus, not intractable    Evaluation consistent with vascular headache, migraine. Patient improved after treatment,  In the ED.  Nursing Notes Reviewed/ Care Coordinated Applicable Imaging Reviewed Interpretation of Laboratory Data incorporated into ED treatment  The patient appears reasonably screened and/or stabilized for discharge and I doubt any other medical condition or other [facility] requiring further screening, evaluation, or treatment in the ED at this time prior to discharge.  Plan: Home Medications- Imetrex; Home Treatments-  rest; return here if the recommended treatment, does not improve the symptoms; Recommended follow up- PCP prn      Daleen Bo, MD 11/20/14 1642

## 2014-11-20 NOTE — ED Notes (Signed)
Patient states she woke up this AM with a migraine left temple area. Patient states she  Has N/V as well. Patient is sensitive to light and noise.

## 2014-11-20 NOTE — ED Notes (Signed)
Wentz at bedside.

## 2014-11-20 NOTE — Discharge Instructions (Signed)

## 2015-01-09 ENCOUNTER — Other Ambulatory Visit: Payer: Self-pay | Admitting: Endocrinology

## 2015-01-09 DIAGNOSIS — E049 Nontoxic goiter, unspecified: Secondary | ICD-10-CM

## 2015-01-15 ENCOUNTER — Ambulatory Visit: Payer: BLUE CROSS/BLUE SHIELD | Admitting: Endocrinology

## 2015-02-09 ENCOUNTER — Encounter (HOSPITAL_COMMUNITY): Payer: Self-pay | Admitting: Emergency Medicine

## 2015-02-09 ENCOUNTER — Emergency Department (HOSPITAL_COMMUNITY): Payer: BLUE CROSS/BLUE SHIELD

## 2015-02-09 ENCOUNTER — Emergency Department (HOSPITAL_COMMUNITY)
Admission: EM | Admit: 2015-02-09 | Discharge: 2015-02-09 | Disposition: A | Discharge status: Home / Self Care | Payer: BLUE CROSS/BLUE SHIELD | Attending: Emergency Medicine | Admitting: Emergency Medicine

## 2015-02-09 DIAGNOSIS — R0789 Other chest pain: Secondary | ICD-10-CM

## 2015-02-09 DIAGNOSIS — R0602 Shortness of breath: Secondary | ICD-10-CM | POA: Insufficient documentation

## 2015-02-09 DIAGNOSIS — R079 Chest pain, unspecified: Secondary | ICD-10-CM | POA: Diagnosis present

## 2015-02-09 DIAGNOSIS — Z862 Personal history of diseases of the blood and blood-forming organs and certain disorders involving the immune mechanism: Secondary | ICD-10-CM | POA: Insufficient documentation

## 2015-02-09 DIAGNOSIS — G43909 Migraine, unspecified, not intractable, without status migrainosus: Secondary | ICD-10-CM | POA: Diagnosis not present

## 2015-02-09 DIAGNOSIS — R110 Nausea: Secondary | ICD-10-CM | POA: Diagnosis not present

## 2015-02-09 DIAGNOSIS — Z8739 Personal history of other diseases of the musculoskeletal system and connective tissue: Secondary | ICD-10-CM | POA: Diagnosis not present

## 2015-02-09 LAB — BASIC METABOLIC PANEL
ANION GAP: 5 (ref 5–15)
BUN: 15 mg/dL (ref 6–20)
CHLORIDE: 104 mmol/L (ref 101–111)
CO2: 31 mmol/L (ref 22–32)
CREATININE: 0.86 mg/dL (ref 0.44–1.00)
Calcium: 9.7 mg/dL (ref 8.9–10.3)
GFR calc Af Amer: >60 mL/min (ref >60)
GFR calc non Af Amer: >60 mL/min (ref >60)
GLUCOSE: 92 mg/dL (ref 65–99)
Potassium: 4.6 mmol/L (ref 3.5–5.1)
Sodium: 140 mmol/L (ref 135–145)

## 2015-02-09 LAB — I-STAT TROPONIN, ED
TROPONIN I, POC: 0 ng/mL (ref 0.00–0.08)
Troponin i, poc: 0 ng/mL (ref 0.00–0.08)

## 2015-02-09 LAB — CBC
HCT: 38.5 % (ref 36.0–46.0)
HEMOGLOBIN: 13.3 g/dL (ref 12.0–15.0)
MCH: 27.6 pg (ref 26.0–34.0)
MCHC: 34.5 g/dL (ref 30.0–36.0)
MCV: 79.9 fL (ref 78.0–100.0)
PLATELETS: 175 10*3/uL (ref 150–400)
RBC: 4.82 MIL/uL (ref 3.87–5.11)
RDW: 14.8 % (ref 11.5–15.5)
WBC: 5.7 10*3/uL (ref 4.0–10.5)

## 2015-02-09 MED ORDER — KETOROLAC TROMETHAMINE 30 MG/ML IJ SOLN
30.0000 mg | Freq: Once | INTRAMUSCULAR | Status: AC
Start: 2015-02-09 — End: 2015-02-09
  Administered 2015-02-09: 30 mg via INTRAVENOUS
  Filled 2015-02-09: qty 1

## 2015-02-09 MED ORDER — GI COCKTAIL ~~LOC~~
30.0000 mL | Freq: Once | ORAL | Status: AC
  Administered 2015-02-09: 30 mL via ORAL
  Filled 2015-02-09: qty 30

## 2015-02-09 MED ORDER — NAPROXEN 500 MG PO TABS: 500.0000 mg | ORAL_TABLET | Freq: Two times a day (BID) | ORAL | Status: DC

## 2015-02-09 NOTE — ED Notes (Signed)
Awake. Verbally responsive. A/O x4. Resp even and unlabored. No audible adventitious breath sounds noted. ABC's intact. SR on monitor. IV SL patent and intact. Family at bedside.

## 2015-02-09 NOTE — ED Notes (Signed)
Awake. Verbally responsive. A/O x4. Resp even and unlabored. No audible adventitious breath sounds noted. ABC's intact. SR on monitor. IV saline lock patent and intact. 

## 2015-02-09 NOTE — Discharge Instructions (Signed)

## 2015-02-09 NOTE — ED Notes (Signed)
Pt reported midsternum chest pain nonradiating, SHOB, diaphoresis, (-) n/v. Pt stated that she has been having the pain intermittently x 1 month but worse this morning. Pt said that the pain decrease with movement. Pt reported lt ear pain since this morning. No drainage noted. Unable to visualize TM d/t cerumen bulidup.

## 2015-02-09 NOTE — ED Notes (Signed)
Awake. Verbally responsive. A/O x4. Resp even and unlabored. No audible adventitious breath sounds noted. ABC's intact. SR on monitor. IV saline lock patent and intact. Family at bedside. 

## 2015-02-09 NOTE — ED Notes (Addendum)
Awake. Verbally responsive. A/O x4. Resp even and unlabored. No audible adventitious breath sounds noted. ABC's intact. SR on monitor. IV saline lock.

## 2015-02-09 NOTE — ED Provider Notes (Signed)
CSN: [id number]     Arrival date & time 02/09/15  1350 History   First MD Initiated Contact with Patient 02/09/15 1456     Chief Complaint  Patient presents with  . Chest Pain     (Consider location/radiation/quality/duration/timing/severity/associated sxs/prior Treatment) Patient is a 49 y.o. female presenting with chest pain. The history is provided by the patient. No language interpreter was used.  Chest Pain Pain location:  Substernal area Pain quality: burning, pressure and sharp   Pain radiates to:  Does not radiate Pain radiates to the back: no   Pain severity:  Moderate Onset quality:  Gradual Duration:  1 month Timing:  Intermittent Progression:  Waxing and waning Chronicity:  New Context: at rest   Relieved by: laying back. Exacerbated by: sittign up, changing position. Associated symptoms: anxiety, nausea and shortness of breath   Associated symptoms: no abdominal pain, no anorexia, no back pain, no claudication, no cough, no diaphoresis, no fatigue, no fever, no headache, no numbness, no palpitations, not vomiting and no weakness   Risk factors: high cholesterol   Risk factors: no birth control, no coronary artery disease, no Ehlers-Danlos syndrome, no hypertension, no immobilization, not female, not obese and not pregnant     Past Medical History  Diagnosis Date  . Migraine     cluster headaches  . Occipital neuralgia   . Sickle cell trait   . Abnormal Pap smear of cervix     yrs ago   Past Surgical History  Procedure Laterality Date  . Tubal ligation    . Cryotherapy      for abnormal paps yrs ago   Family History  Problem Relation Age of Onset  . Diabetes Mother   . Diabetes Sister   . Hypertension Sister    History  Substance Use Topics  . Smoking status: Never Smoker   . Smokeless tobacco: Never Used  . Alcohol Use: No   OB History    Gravida Para Term Preterm AB TAB SAB Ectopic Multiple Living   4 4 4       8      Review of Systems    Constitutional: Negative for fever, chills, diaphoresis, activity change, appetite change and fatigue.  HENT: Negative for congestion, facial swelling, rhinorrhea and sore throat.   Eyes: Negative for photophobia and discharge.  Respiratory: Positive for shortness of breath. Negative for cough and chest tightness.   Cardiovascular: Positive for chest pain. Negative for palpitations, claudication and leg swelling.  Gastrointestinal: Positive for nausea. Negative for vomiting, abdominal pain, diarrhea and anorexia.  Endocrine: Negative for polydipsia and polyuria.  Genitourinary: Negative for dysuria, frequency, difficulty urinating and pelvic pain.  Musculoskeletal: Negative for back pain, arthralgias, neck pain and neck stiffness.  Skin: Negative for color change and wound.  Allergic/Immunologic: Negative for immunocompromised state.  Neurological: Negative for facial asymmetry, weakness, numbness and headaches.  Hematological: Does not bruise/bleed easily.  Psychiatric/Behavioral: Negative for confusion and agitation.      Allergies  Review of patient's allergies indicates no known allergies.  Home Medications   Prior to Admission medications   Medication Sig Start Date End Date Taking? Authorizing Provider  SUMAtriptan (IMITREX) 50 MG tablet Take 1 tablet (50 mg total) by mouth every 2 (two) hours as needed for migraine. May repeat in 2 hours if headache persists or recurs. 11/20/14  Yes Daleen Bo, MD  naproxen (NAPROSYN) 500 MG tablet Take 1 tablet (500 mg total) by mouth 2 (two) times daily with a  meal. 02/09/15   Ernestina Patches, MD   BP 111/72 mmHg  Pulse 72  Temp(Src) 98.2 F (36.8 C) (Oral)  Resp 21  SpO2 99% Physical Exam  Constitutional: She is oriented to person, place, and time. She appears well-developed and well-nourished. No distress.  HENT:  Head: Normocephalic and atraumatic.  Mouth/Throat: No oropharyngeal exudate.  Eyes: Pupils are equal, round, and  reactive to light.  Neck: Normal range of motion. Neck supple.  Cardiovascular: Normal rate, regular rhythm and normal heart sounds.  Exam reveals no gallop and no friction rub.   No murmur heard. Pulmonary/Chest: Effort normal and breath sounds normal. No respiratory distress. She has no wheezes. She has no rales.      Abdominal: Soft. Bowel sounds are normal. She exhibits no distension and no mass. There is no tenderness. There is no rebound and no guarding.  Musculoskeletal: Normal range of motion. She exhibits no edema or tenderness.  Neurological: She is alert and oriented to person, place, and time.  Skin: Skin is warm and dry.  Psychiatric: She has a normal mood and affect.    ED Course  Procedures (including critical care time) Labs Review Labs Reviewed  BASIC METABOLIC PANEL  CBC  I-STAT [place], ED  Randolm Idol, ED    Imaging Review Dg Chest Port 1 View  02/09/2015   CLINICAL DATA:  Central chest pain starting this morning. History of sickle cell trait.  EXAM: PORTABLE CHEST - 1 VIEW  COMPARISON:  None.  FINDINGS: The heart size and mediastinal contours are within normal limits. Both lungs are clear. The visualized skeletal structures are unremarkable.  IMPRESSION: No active disease.   Electronically Signed   By: Van Clines M.D.   On: 02/09/2015 14:57     EKG Interpretation   Date/Time:  Sunday February 09 2015 14:04:00 EDT Ventricular Rate:  78 PR Interval:  183 QRS Duration: 64 QT Interval:  377 QTC Calculation: 429 R Axis:   63 Text Interpretation:  Sinus rhythm Sinus rhythm Artifact Abnormal ekg  Confirmed by Carmin Muskrat  MD ([id number]) on 02/09/2015 2:12:29 PM      MDM   Final diagnoses:  Atypical chest pain    Pt is a 49 y.o. female with Pmhx as above who presents with 1 month of intermittent central CP w/o radiation.  Pain usually last 7-10 minutes at a time.  However, today she woke up the pain at 8 AM and this continued pain is worse  with sitting up and worse with movement of arm.  She's had some associated nausea without vomiting as well as shortness of breath without fever, chills, cough, leg pain or swelling.  She's no history or risk factors for PE or DVT.  On physical exam, vital signs are stable and she is in no acute distress.  She is reproducible central chest pain and back pain.  Troponin is negative.  Chest x-ray is normal.  CBC and BMP are grossly unremarkable.  We'll plan for three-hour delta troponin.  Patient given IM Toradol and GI cocktail.  TIMI 0. PERC 0.     Dr. troponin is negative.  I feel patient much more likely to be GI or musculoskeletal in nature.  I've [facility] with primary care.  We'll recommend trial of anti-inflammatories, scheduled.    Candence Concannon evaluation in the Emergency Department is complete. It has been determined that no acute conditions requiring further emergency intervention  are present at this time. The patient/guardian have been advised of the diagnosis and plan. We have discussed signs and symptoms that warrant return to the ED, such as changes or worsening in symptoms, worsening pain, fever, inability to tolerate liquids      Ernestina Patches, MD 02/09/15 1836

## 2015-02-09 NOTE — ED Notes (Signed)
Awake. Verbally responsive. A/O x4. Resp even and unlabored. No audible adventitious breath sounds noted. ABC's intact.  

## 2015-07-09 ENCOUNTER — Other Ambulatory Visit: Payer: BLUE CROSS/BLUE SHIELD

## 2015-07-15 ENCOUNTER — Ambulatory Visit
Admission: RE | Admit: 2015-07-15 | Discharge: 2015-07-15 | Disposition: A | Discharge status: Home / Self Care | Payer: BLUE CROSS/BLUE SHIELD | Source: Ambulatory Visit | Attending: Endocrinology | Admitting: Endocrinology

## 2015-07-15 DIAGNOSIS — E049 Nontoxic goiter, unspecified: Secondary | ICD-10-CM

## 2015-08-19 ENCOUNTER — Ambulatory Visit: Payer: BC Managed Care – PPO | Admitting: Certified Nurse Midwife

## 2015-08-28 ENCOUNTER — Ambulatory Visit (INDEPENDENT_AMBULATORY_CARE_PROVIDER_SITE_OTHER): Payer: BLUE CROSS/BLUE SHIELD | Admitting: Certified Nurse Midwife

## 2015-08-28 ENCOUNTER — Encounter: Payer: Self-pay | Admitting: Certified Nurse Midwife

## 2015-08-28 VITALS — BP 110/70 | HR 70 | Resp 16 | Ht 65.75 in | Wt 165.0 lb

## 2015-08-28 DIAGNOSIS — E049 Nontoxic goiter, unspecified: Secondary | ICD-10-CM | POA: Diagnosis not present

## 2015-08-28 DIAGNOSIS — Z01419 Encounter for gynecological examination (general) (routine) without abnormal findings: Secondary | ICD-10-CM

## 2015-08-28 DIAGNOSIS — N898 Other specified noninflammatory disorders of vagina: Secondary | ICD-10-CM

## 2015-08-28 DIAGNOSIS — N912 Amenorrhea, unspecified: Secondary | ICD-10-CM

## 2015-08-28 DIAGNOSIS — N951 Menopausal and female climacteric states: Secondary | ICD-10-CM

## 2015-08-28 NOTE — Patient Instructions (Signed)
EXERCISE AND DIET:  We recommended that you start or continue a regular exercise program for good health. Regular exercise means any activity that makes your heart beat faster and makes you sweat.  We recommend exercising at least 30 minutes per day at least 3 days a week, preferably 4 or 5.  We also recommend a diet low in fat and sugar.  Inactivity, poor dietary choices and obesity can cause diabetes, heart attack, stroke, and kidney damage, among others.    ALCOHOL AND SMOKING:  Women should limit their alcohol intake to no more than 7 drinks/beers/glasses of wine (combined, not each!) per week. Moderation of alcohol intake to this level decreases your risk of breast cancer and liver damage. And of course, no recreational drugs are part of a healthy lifestyle.  And absolutely no smoking or even second hand smoke. Most people know smoking can cause heart and lung diseases, but did you know it also contributes to weakening of your bones? Aging of your skin?  Yellowing of your teeth and nails?  CALCIUM AND VITAMIN D:  Adequate intake of calcium and Vitamin D are recommended.  The recommendations for exact amounts of these supplements seem to change often, but generally speaking 600 mg of calcium (either carbonate or citrate) and 800 units of Vitamin D per day seems prudent. Certain women may benefit from higher intake of Vitamin D.  If you are among these women, your doctor will have told you during your visit.    PAP SMEARS:  Pap smears, to check for cervical cancer or precancers,  have traditionally been done yearly, although recent scientific advances have shown that most women can have pap smears less often.  However, every woman still should have a physical exam from her gynecologist every year. It will include a breast check, inspection of the vulva and vagina to check for abnormal growths or skin changes, a visual exam of the cervix, and then an exam to evaluate the size and shape of the uterus and  ovaries.  And after 48 years of age, a rectal exam is indicated to check for rectal cancers. We will also provide age appropriate advice regarding health maintenance, like when you should have certain vaccines, screening for sexually transmitted diseases, bone density testing, colonoscopy, mammograms, etc.   MAMMOGRAMS:  All women over 40 years old should have a yearly mammogram. Many facilities now offer a "3D" mammogram, which may cost around $50 extra out of pocket. If possible,  we recommend you accept the option to have the 3D mammogram performed.  It both reduces the number of women who will be called back for extra views which then turn out to be normal, and it is better than the routine mammogram at detecting truly abnormal areas.    COLONOSCOPY:  Colonoscopy to screen for colon cancer is recommended for all women at age 50.  We know, you hate the idea of the prep.  We agree, BUT, having colon cancer and not knowing it is worse!!  Colon cancer so often starts as a polyp that can be seen and removed at colonscopy, which can quite literally save your life!  And if your first colonoscopy is normal and you have no family history of colon cancer, most women don't have to have it again for 10 years.  Once every ten years, you can do something that may end up saving your life, right?  We will be happy to help you get it scheduled when you are ready.    Be sure to check your insurance coverage so you understand how much it will cost.  It may be covered as a preventative service at no cost, but you should check your particular policy.     Perimenopause Perimenopause is the time when your body begins to move into the menopause (no menstrual period for 12 straight months). It is a natural process. Perimenopause can begin 2-8 years before the menopause and usually lasts for 1 year after the menopause. During this time, your ovaries may or may not produce an egg. The ovaries vary in their production of estrogen and  progesterone hormones each month. This can cause irregular menstrual periods, difficulty getting pregnant, vaginal bleeding between periods, and uncomfortable symptoms. CAUSES  Irregular production of the ovarian hormones, estrogen and progesterone, and not ovulating every month.  Other causes include:  Tumor of the pituitary gland in the brain.  Medical disease that affects the ovaries.  Radiation treatment.  Chemotherapy.  Unknown causes.  Heavy smoking and excessive alcohol intake can bring on perimenopause sooner. SIGNS AND SYMPTOMS   Hot flashes.  Night sweats.  Irregular menstrual periods.  Decreased sex drive.  Vaginal dryness.  Headaches.  Mood swings.  Depression.  Memory problems.  Irritability.  Tiredness.  Weight gain.  Trouble getting pregnant.  The beginning of losing bone cells (osteoporosis).  The beginning of hardening of the arteries (atherosclerosis). DIAGNOSIS  Your health care provider will make a diagnosis by analyzing your age, menstrual history, and symptoms. He or she will do a physical exam and note any changes in your body, especially your female organs. Female hormone tests may or may not be helpful depending on the amount of female hormones you produce and when you produce them. However, other hormone tests may be helpful to rule out other problems. TREATMENT  In some cases, no treatment is needed. The decision on whether treatment is necessary during the perimenopause should be made by you and your health care provider based on how the symptoms are affecting you and your lifestyle. Various treatments are available, such as:  Treating individual symptoms with a specific medicine for that symptom.  Herbal medicines that can help specific symptoms.  Counseling.  Group therapy. HOME CARE INSTRUCTIONS   Keep track of your menstrual periods (when they occur, how heavy they are, how long between periods, and how long they last) as  well as your symptoms and when they started.  Only take over-the-counter or prescription medicines as directed by your health care provider.  Sleep and rest.  Exercise.  Eat a diet that contains calcium (good for your bones) and soy (acts like the estrogen hormone).  Do not smoke.  Avoid alcoholic beverages.  Take vitamin supplements as recommended by your health care provider. Taking vitamin E may help in certain cases.  Take calcium and vitamin D supplements to help prevent bone loss.  Group therapy is sometimes helpful.  Acupuncture may help in some cases. SEEK MEDICAL CARE IF:   You have questions about any symptoms you are having.  You need a referral to a specialist (gynecologist, psychiatrist, or psychologist). SEEK IMMEDIATE MEDICAL CARE IF:   You have vaginal bleeding.  Your period lasts longer than 8 days.  Your periods are recurring sooner than 21 days.  You have bleeding after intercourse.  You have severe depression.  You have pain when you urinate.  You have severe headaches.  You have vision problems.   This information is not intended to replace advice   given to you by your health care provider. Make sure you discuss any questions you have with your health care provider.   Document Released: 09/09/2004 Document Revised: 08/23/2014 Document Reviewed: 03/01/2013 Elsevier Interactive Patient Education 2016 [employer] Inc[employer]  

## 2015-08-28 NOTE — Progress Notes (Signed)
50 y.o. [id number] married African American Fe here for annual exam. Periods normal, no issues until no period in 04/21/15. No spotting since that time. Some hot flashes and night sweats. Recent marriage in 9/16!!. Sees [facility] for PCP care.Sees Dr. Chalmers Cater for endocrine care, all normal at this time. Patient was seen in ER for cluster headache, and was given Imitrex? Which helped. Plans to see [facility] to follow up with. No other health concerns today.    Patient's last menstrual period was 04/21/2015.          Sexually active: Yes.    The current method of family planning is tubal ligation.    Exercising: Yes.    walking & stretching Smoker:  no  Health Maintenance: Pap:  07-31-14 neg HPV HR neg Cryo 1991 MMG:  11/15 neg Colonoscopy:  none BMD:   none TDaP:  2015 Shingles: no Pneumonia: no Hep C and HIV: unsure Labs: none Self breast exam: done occ   reports that she has never smoked. She has never used smokeless tobacco. She reports that she does not drink alcohol or use illicit drugs.  Past Medical History  Diagnosis Date  . Migraine     cluster headaches  . Occipital neuralgia   . Sickle cell trait ([place])   . Abnormal Pap smear of cervix     yrs ago    Past Surgical History  Procedure Laterality Date  . Tubal ligation    . Cryotherapy      for abnormal paps yrs ago    Current Outpatient Prescriptions  Medication Sig Dispense Refill  . SUMAtriptan (IMITREX) 50 MG tablet Take 1 tablet (50 mg total) by mouth every 2 (two) hours as needed for migraine. May repeat in 2 hours if headache persists or recurs. (Patient not taking: Reported on 08/28/2015) 10 tablet 0   No current facility-administered medications for this visit.    Family History  Problem Relation Age of Onset  . Diabetes Mother   . Diverticulitis Mother   . Diabetes Sister   . Hypertension Sister   . Stroke Sister     ROS:  Pertinent items are noted in HPI.  Otherwise, a comprehensive ROS was  negative.  Exam:   BP 110/70 mmHg  Pulse 70  Resp 16  Ht 5' 5.75" (1.67 m)  Wt 165 lb (74.844 kg)  BMI 26.84 kg/m2  LMP 04/21/2015 Height: 5' 5.75" (167 cm) Ht Readings from Last 3 Encounters:  08/28/15 5' 5.75" (1.67 m)  09/16/14 5' 5.5" (1.664 m)  07/31/14 5' 5.5" (1.664 m)    General appearance: alert, cooperative and appears stated age Head: Normocephalic, without obvious abnormality, atraumatic Neck: no adenopathy, supple, symmetrical, trachea midline and thyroid normal to inspection and palpation and enlarged Lungs: clear to auscultation bilaterally Breasts: normal appearance, no masses or tenderness, No nipple retraction or dimpling, No nipple discharge or bleeding, No axillary or supraclavicular adenopathy Heart: regular rate and rhythm Abdomen: soft, non-tender; no masses,  no organomegaly Extremities: extremities normal, atraumatic, no cyanosis or edema Skin: Skin color, texture, turgor normal. No rashes or lesions Lymph nodes: Cervical, supraclavicular, and axillary nodes normal. No abnormal inguinal nodes palpated Neurologic: Grossly normal   Pelvic: External genitalia:  no lesions              Urethra:  normal appearing urethra with no masses, tenderness or lesions              Bartholin's and Skene's: normal  Vagina: normal appearing vagina with normal color and discharge, no lesions              Cervix: normal appearance, no lesions or tenderness              Pap taken: No. Bimanual Exam:  Uterus:  normal size, contour, position, consistency, mobility, non-tender              Adnexa: normal adnexa and no mass, fullness, tenderness               Rectovaginal: Confirms               Anus:  normal sphincter tone, no lesions  Chaperone present: yes  A:  Well Woman with normal exam  Contraception Tubal ligation  Perimenopausal with amenorrhea  Right vaginal wall cyst  Enlarged thyroid with Endocrine management  Cluster headache with PCP  care  P:   Reviewed health and wellness pertinent to exam  Discussed perimenopause and etiology. Discussed importance of notifying if no period. Will need to check labs for status if menopausal. Patient agreeable. May need Provera challenge, discussed with patient and agreeable if needed.Questions addressed.  Lab: TSH,Prolactin, FSH  Discussed benign finding of vaginal wall cyst on right, small. No indication for removal, may spontaneously resolve. Questions addressed.  Continue follow up with MD's as indicated.  Pap smear as above not taken   counseled on breast self exam, mammography screening, adequate intake of calcium and vitamin D, diet and exercise  return annually or prn  An After Visit Summary was printed and given to the patient.

## 2015-08-29 LAB — FOLLICLE STIMULATING HORMONE: FSH: 36.7 m[IU]/mL

## 2015-08-29 LAB — PROLACTIN: Prolactin: 10.5 ng/mL

## 2015-08-29 LAB — TSH: TSH: 0.82 u[IU]/mL (ref 0.350–4.500)

## 2015-08-31 NOTE — Progress Notes (Signed)
Reviewed personally.  M. Suzanne Cassondra Stachowski, MD.  

## 2015-09-03 ENCOUNTER — Telehealth: Payer: Self-pay | Admitting: Emergency Medicine

## 2015-09-03 MED ORDER — MEDROXYPROGESTERONE ACETATE 10 MG PO TABS: 10.0000 mg | ORAL_TABLET | Freq: Every day | ORAL | Status: DC

## 2015-09-03 NOTE — Telephone Encounter (Signed)
Call to patient and message from Kem Boroughs, [place]. Instructions for provera challenge given.  Patient to take Provera 10 mg po daily x 10 days. Will expect menses (withdrawl bleed) up to two weeks after completing medication. Advised patient to call back when starts cycle. Advised patient to call our office if does not start cycle after two weeks of completing provera. If starts cycle while taking provera, can stop taking provera and call our office. Patient verbalized understanding of  Instructions and will call back as directed.  Routing to provider for final review. Patient agreeable to disposition. Will close encounter.

## 2015-09-03 NOTE — Telephone Encounter (Signed)
-----   Message from Kem Boroughs, [place] sent at 08/29/2015  6:57 PM EST ----- Please inform pt that [facility] is only a little elevated.  The prolactin and TSH are in normal range.  Lets do a Provera 10 mg challenge for 10 days and then call back with +/- response.  Med's were not sent in until she is called.

## 2015-09-09 ENCOUNTER — Other Ambulatory Visit: Payer: Self-pay

## 2015-09-09 DIAGNOSIS — Z1231 Encounter for screening mammogram for malignant neoplasm of breast: Secondary | ICD-10-CM

## 2015-09-17 ENCOUNTER — Ambulatory Visit: Payer: BLUE CROSS/BLUE SHIELD

## 2015-09-24 ENCOUNTER — Ambulatory Visit
Admission: RE | Admit: 2015-09-24 | Discharge: 2015-09-24 | Disposition: A | Discharge status: Home / Self Care | Payer: BLUE CROSS/BLUE SHIELD | Source: Ambulatory Visit

## 2015-09-24 DIAGNOSIS — Z1231 Encounter for screening mammogram for malignant neoplasm of breast: Secondary | ICD-10-CM

## 2016-02-07 IMAGING — CR DG WRIST COMPLETE 3+V*R*
4 series · 4 of 4 positions shown · non-contrast
Comparison: None.

CLINICAL DATA: Four 5 month history of right wrist pain ; palpable
knot over the distal aspect of the ulna; no known injury

EXAM:
RIGHT WRIST - COMPLETE 3+ VIEW

[view not recorded (1 of 4)]
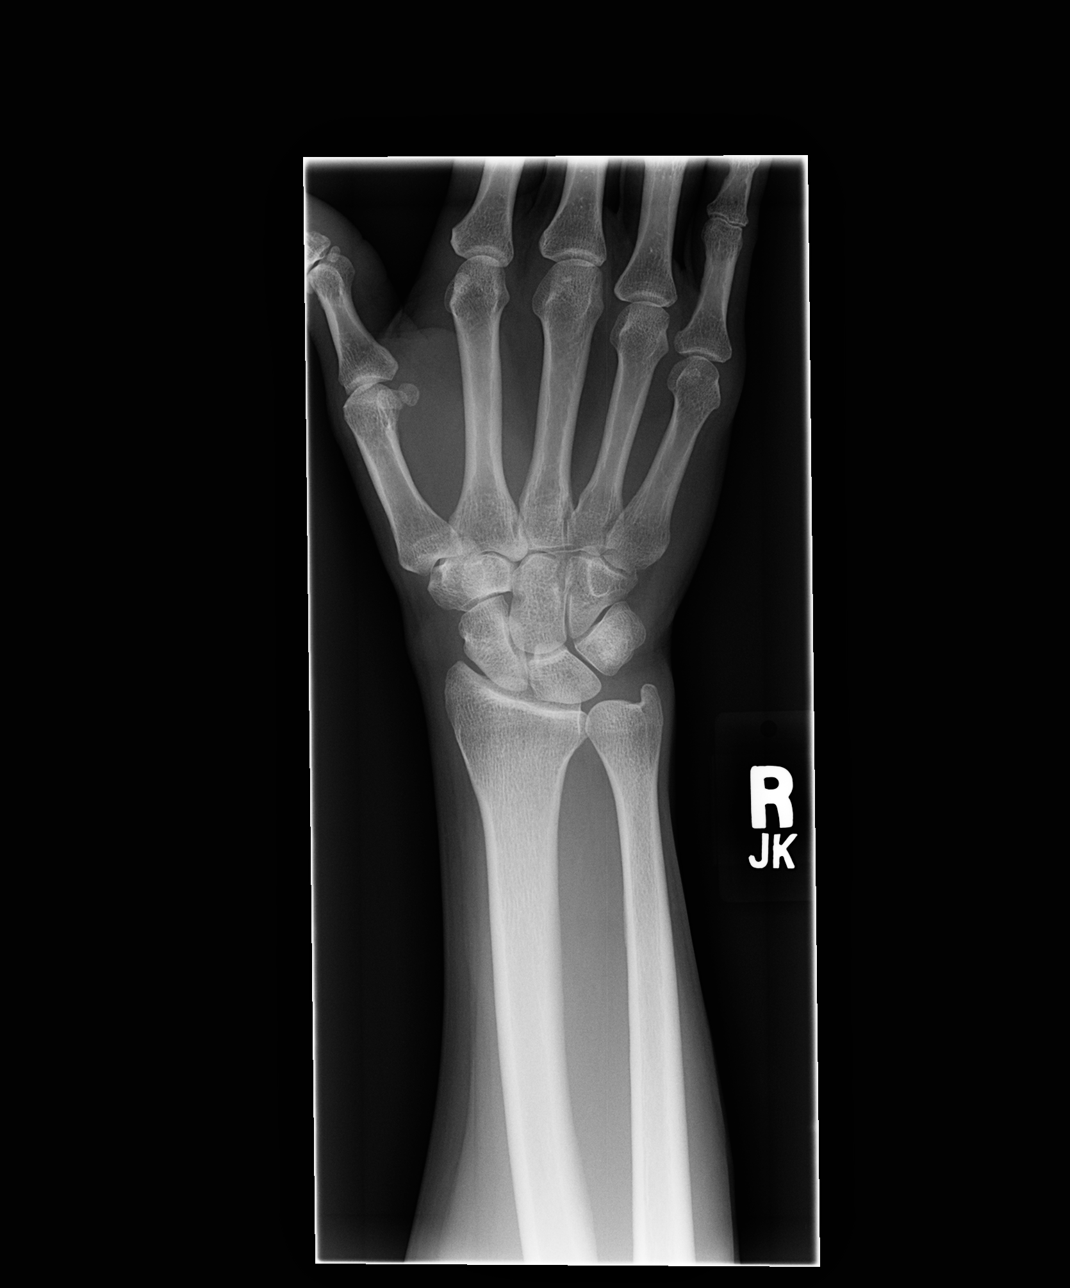

[view not recorded (2 of 4)]
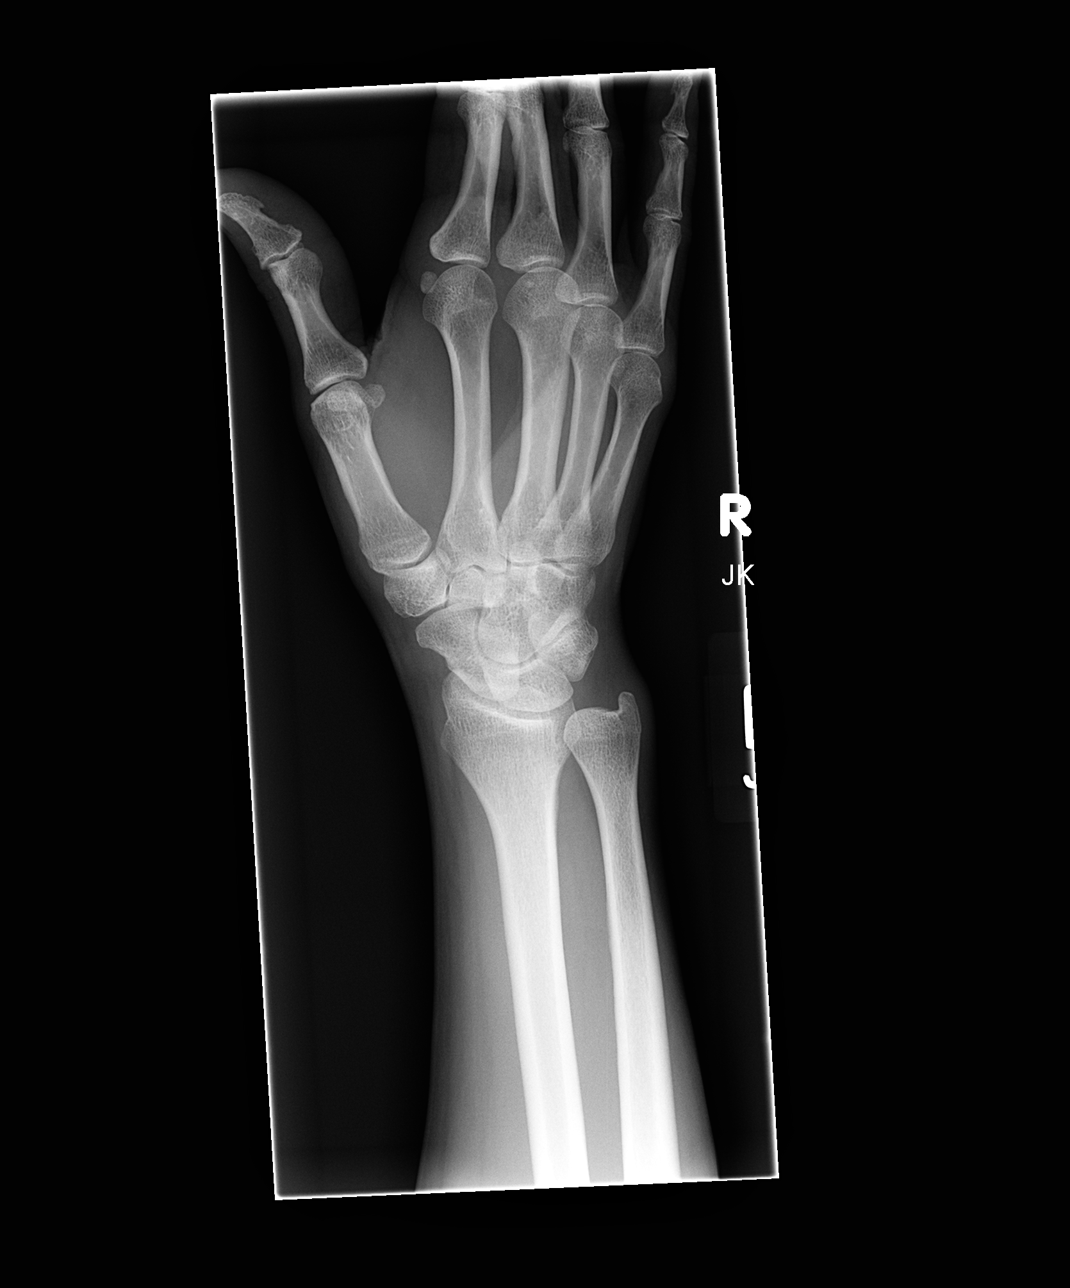

[view not recorded (3 of 4)]
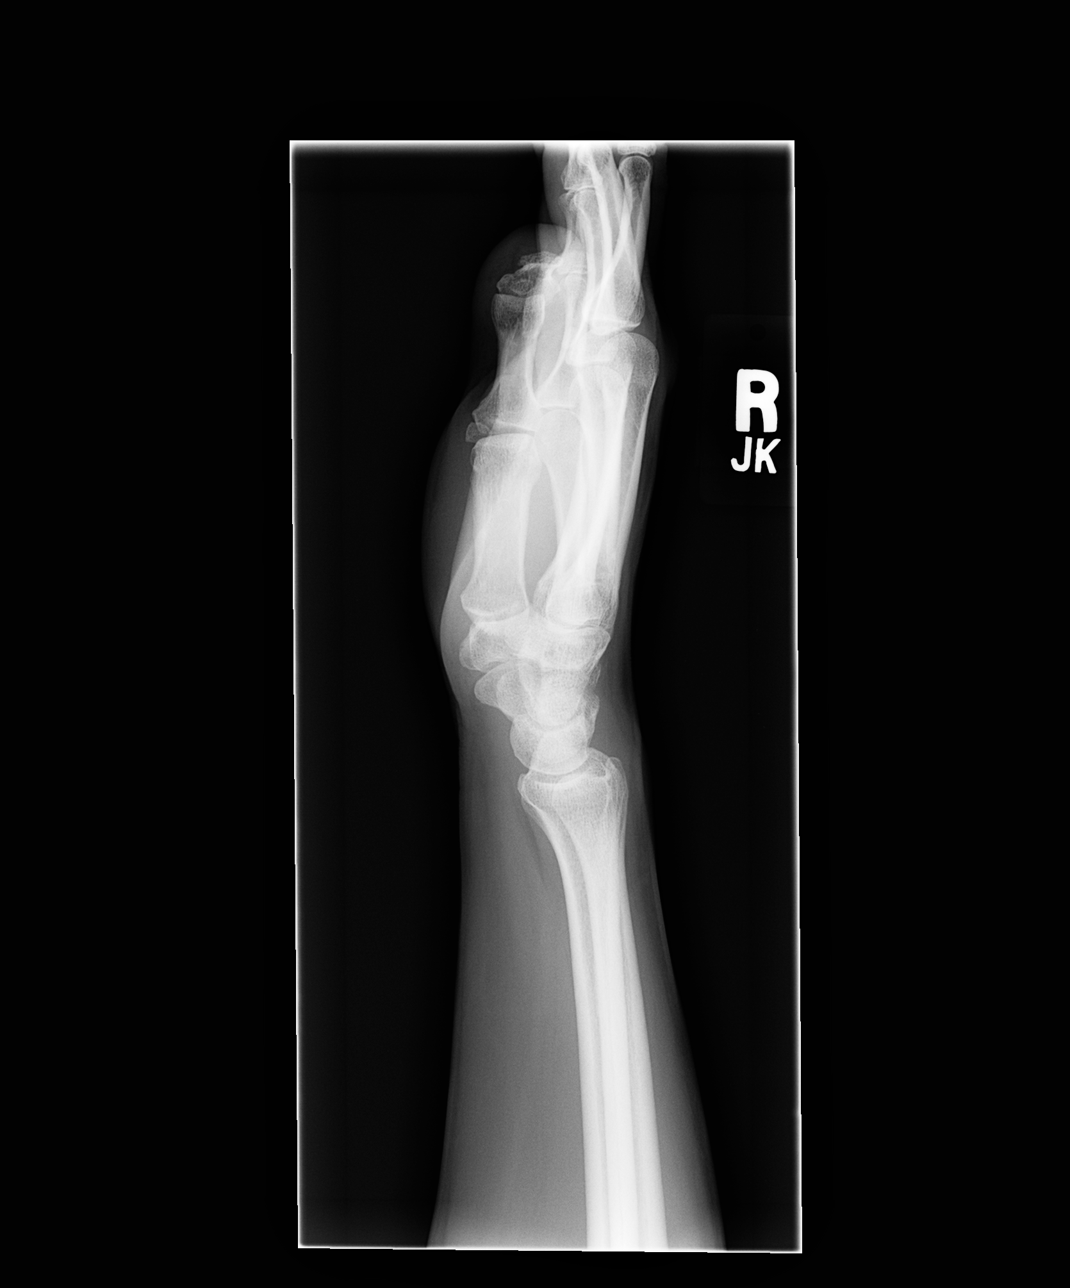

[view not recorded (4 of 4)]
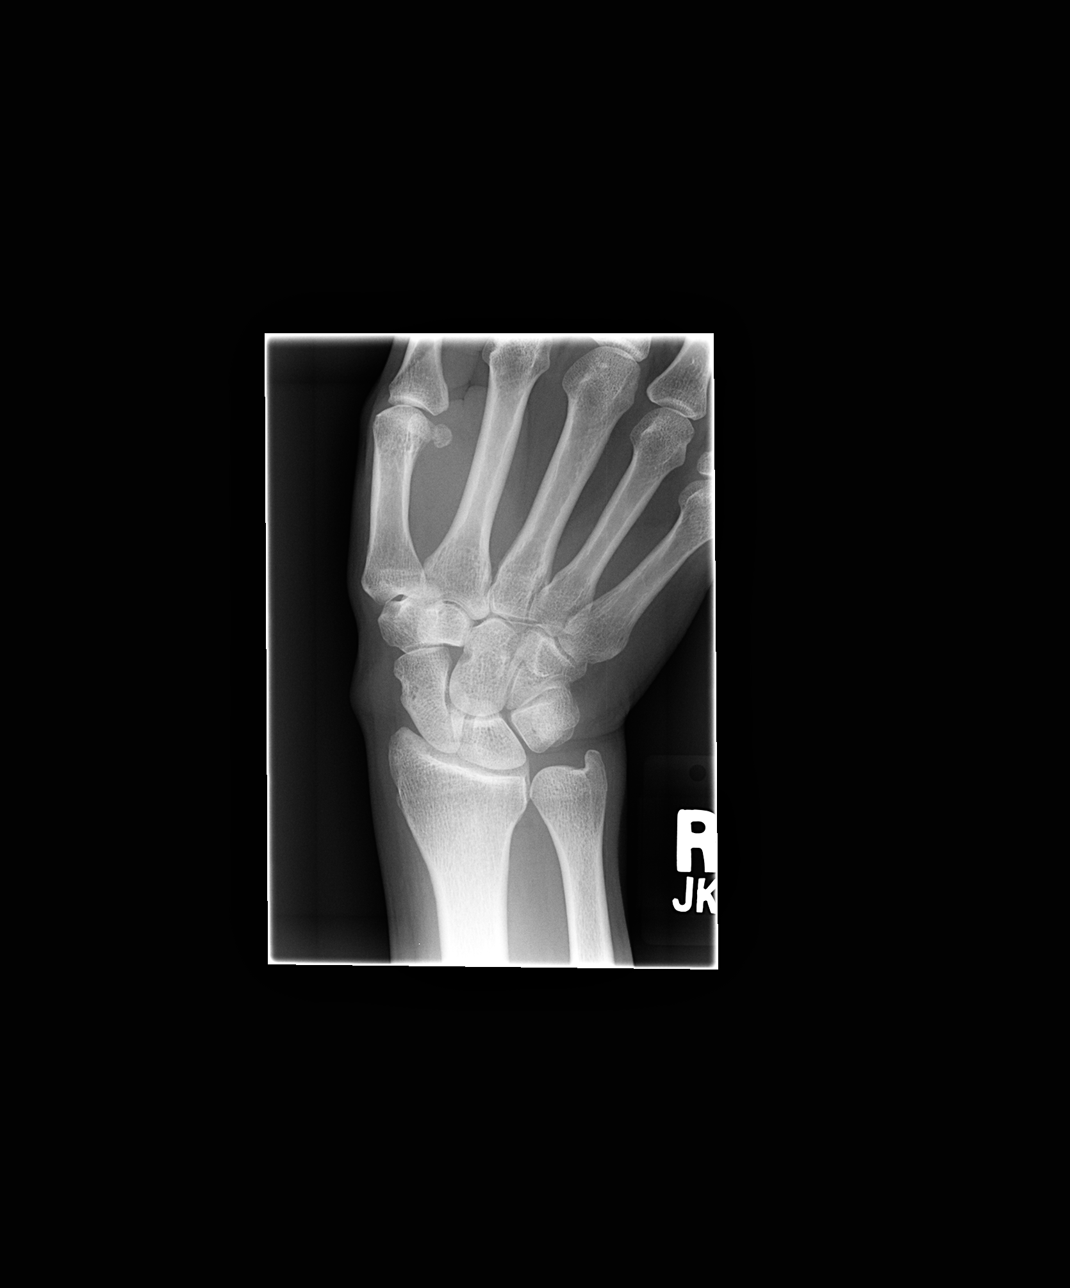

[4 of 4 positions shown; findings below may reference images not displayed]

FINDINGS: The bones of the right wrist are adequately mineralized. There is no
acute or old fracture nor dislocation. Specific attention to the
distal ulna reveals no abnormality. The joint spaces are reasonably
well maintained. There is no chondrocalcinosis. The soft tissues
exhibit no abnormal masses.
IMPRESSION: There is no acute or significant chronic bony abnormality of the
right wrist. If the patient's symptoms persist and remain
unexplained, MRI may be useful.

## 2016-06-27 ENCOUNTER — Encounter (HOSPITAL_COMMUNITY): Payer: Self-pay | Admitting: Emergency Medicine

## 2016-06-27 ENCOUNTER — Emergency Department (HOSPITAL_COMMUNITY)
Admission: EM | Admit: 2016-06-27 | Discharge: 2016-06-27 | Disposition: A | Discharge status: Home / Self Care | Payer: BLUE CROSS/BLUE SHIELD | Attending: Emergency Medicine | Admitting: Emergency Medicine

## 2016-06-27 DIAGNOSIS — G43909 Migraine, unspecified, not intractable, without status migrainosus: Secondary | ICD-10-CM

## 2016-06-27 MED ORDER — KETOROLAC TROMETHAMINE 30 MG/ML IJ SOLN
30.0000 mg | Freq: Once | INTRAMUSCULAR | Status: AC
  Administered 2016-06-27: 30 mg via INTRAVENOUS
  Filled 2016-06-27: qty 1

## 2016-06-27 MED ORDER — VALPROATE SODIUM 500 MG/5ML IV SOLN
500.0000 mg | Freq: Once | INTRAVENOUS | Status: AC
  Administered 2016-06-27: 500 mg via INTRAVENOUS
  Filled 2016-06-27: qty 5

## 2016-06-27 MED ORDER — METOCLOPRAMIDE HCL 5 MG/ML IJ SOLN
10.0000 mg | Freq: Once | INTRAMUSCULAR | Status: AC
  Administered 2016-06-27: 10 mg via INTRAVENOUS
  Filled 2016-06-27: qty 2

## 2016-06-27 MED ORDER — DIPHENHYDRAMINE HCL 50 MG/ML IJ SOLN
25.0000 mg | Freq: Once | INTRAMUSCULAR | Status: AC
  Administered 2016-06-27: 25 mg via INTRAVENOUS
  Filled 2016-06-27: qty 1

## 2016-06-27 NOTE — Discharge Instructions (Signed)
With your primary care physician as needed

## 2016-06-27 NOTE — ED Provider Notes (Signed)
[facility] DEPT Provider Note   CSN: [id number] Arrival date & time: 06/27/16  1032     History   Chief Complaint Chief Complaint  Patient presents with  . Migraine  . Nausea  . Emesis    HPI Jenny Obrien is a 50 y.o. female. She reports a history of migraine and cluster headaches. She waking this morning in her normal state of health. At 8 AM she started getting slight left-sided headache described as a throbbing and some nausea. It is progressed. She rates it as 8/10. It was not sudden or thunderclap. However she states it is a worst migraine that she has never felt. No numbness weakness tingling to extremities. Photophobia but no vision changes. No additional symptoms.  HPI  Past Medical History:  Diagnosis Date  . Abnormal Pap smear of cervix    yrs ago  . Migraine    cluster headaches  . Occipital neuralgia   . Sickle cell trait [facility])     Patient Active Problem List   Diagnosis Date Noted  . Dysphagia 09/16/2014  . Migraine, unspecified, without mention of intractable migraine without mention of status migrainosus 11/06/2012  . Occipital neuralgia     Past Surgical History:  Procedure Laterality Date  . CRYOTHERAPY     for abnormal paps yrs ago  . TUBAL LIGATION      OB History    Gravida Para Term Preterm AB Living   4 4 4     3    SAB TAB Ectopic Multiple Live Births           3      Obstetric Comments   Daughter was murdered       Home Medications    Prior to Admission medications   Medication Sig Start Date End Date Taking? Authorizing Provider  pantoprazole (PROTONIX) 40 MG tablet Take 40 mg by mouth daily.   Yes Historical Provider, MD  SUMAtriptan (IMITREX) 50 MG tablet Take 1 tablet (50 mg total) by mouth every 2 (two) hours as needed for migraine. May repeat in 2 hours if headache persists or recurs. 11/20/14  Yes Daleen Bo, MD  medroxyPROGESTERone (PROVERA) 10 MG tablet Take 1 tablet (10 mg total) by mouth daily. Patient not taking:  Reported on 06/27/2016 09/03/15   Kem Boroughs, FNP    Family History Family History  Problem Relation Age of Onset  . Diabetes Mother   . Diverticulitis Mother   . Diabetes Sister   . Hypertension Sister   . Stroke Sister     Social History Social History  Substance Use Topics  . Smoking status: Never Smoker  . Smokeless tobacco: Never Used  . Alcohol use No     Allergies   Patient has no known allergies.   Review of Systems Review of Systems  Constitutional: Negative for appetite change, chills, diaphoresis, fatigue and fever.  HENT: Negative for mouth sores, sore throat and trouble swallowing.   Eyes: Positive for photophobia. Negative for visual disturbance.  Respiratory: Negative for cough, chest tightness, shortness of breath and wheezing.   Cardiovascular: Negative for chest pain.  Gastrointestinal: Negative for abdominal distention, abdominal pain, diarrhea, nausea and vomiting.  Endocrine: Negative for polydipsia, polyphagia and polyuria.  Genitourinary: Negative for dysuria, frequency and hematuria.  Musculoskeletal: Negative for gait problem and neck pain.  Skin: Negative for color change, pallor and rash.  Neurological: Positive for headaches. Negative for dizziness, syncope and light-headedness.  Hematological: Does not bruise/bleed easily.  Psychiatric/Behavioral: Negative for  behavioral problems and confusion.     Physical Exam Updated Vital Signs BP 95/62 (BP Location: Right Arm)   Pulse 75   Temp 97.8 F (36.6 C) (Oral)   Resp 18   SpO2 98%   Physical Exam  Constitutional: She is oriented to person, place, and time. She appears well-developed and well-nourished. No distress.  HENT:  Head: Normocephalic.  Eyes: Conjunctivae are normal. Pupils are equal, round, and reactive to light. No scleral icterus.  Neck: Normal range of motion. Neck supple. No thyromegaly present.  Cardiovascular: Normal rate and regular rhythm.  Exam reveals no gallop  and no friction rub.   No murmur heard. Pulmonary/Chest: Effort normal and breath sounds normal. No respiratory distress. She has no wheezes. She has no rales.  Abdominal: Soft. Bowel sounds are normal. She exhibits no distension. There is no tenderness. There is no rebound.  Musculoskeletal: Normal range of motion.  Neurological: She is alert and oriented to person, place, and time.  Cranial nerves intact. Symmetric reactive pupils. No unilateral weakness. Supple neck. No fever. Mental status normal  Skin: Skin is warm and dry. No rash noted.  Psychiatric: She has a normal mood and affect. Her behavior is normal.     ED Treatments / Results  Labs (all labs ordered are listed, but only abnormal results are displayed) Labs Reviewed - No data to display  EKG  EKG Interpretation None       Radiology No results found.  Procedures Procedures (including critical care time)  Medications Ordered in ED Medications  metoCLOPramide (REGLAN) injection 10 mg (10 mg Intravenous Given 06/27/16 1151)  ketorolac (TORADOL) 30 MG/ML injection 30 mg (30 mg Intravenous Given 06/27/16 1151)  diphenhydrAMINE (BENADRYL) injection 25 mg (25 mg Intravenous Given 06/27/16 1151)  valproate (DEPACON) 500 mg in dextrose 5 % 50 mL IVPB (0 mg Intravenous Stopped 06/27/16 1339)     Initial Impression / Assessment and Plan / ED Course  I have reviewed the triage vital signs and the nursing notes.  Pertinent labs & imaging results that were available during my care of the patient were reviewed by me and considered in my medical decision making (see chart for details).  Clinical Course     Given all, Benadryl, Reglan IV. Also given Depakote 500 mg IV. Recheck her headache is down to a 1/10 she is anxious to be discharged.  Final Clinical Impressions(s) / ED Diagnoses   Final diagnoses:  Migraine without status migrainosus, not intractable, unspecified migraine type    New Prescriptions New  Prescriptions   No medications on file     Tanna Furry, MD 06/27/16 1422

## 2016-06-27 NOTE — ED Triage Notes (Signed)
Patient here with complaints of migraine that started this morning around 8am. Nausea/vomiting. Hx of same.

## 2016-09-01 ENCOUNTER — Ambulatory Visit: Payer: BLUE CROSS/BLUE SHIELD | Admitting: Certified Nurse Midwife

## 2016-09-15 ENCOUNTER — Other Ambulatory Visit: Payer: Self-pay | Admitting: Family Medicine

## 2016-09-15 DIAGNOSIS — Z1231 Encounter for screening mammogram for malignant neoplasm of breast: Secondary | ICD-10-CM

## 2016-09-29 ENCOUNTER — Emergency Department (HOSPITAL_COMMUNITY)
Admission: EM | Admit: 2016-09-29 | Discharge: 2016-09-29 | Disposition: A | Discharge status: Home / Self Care | Payer: BLUE CROSS/BLUE SHIELD | Attending: Physician Assistant | Admitting: Physician Assistant

## 2016-09-29 ENCOUNTER — Encounter (HOSPITAL_COMMUNITY): Payer: Self-pay | Admitting: Emergency Medicine

## 2016-09-29 DIAGNOSIS — M25511 Pain in right shoulder: Secondary | ICD-10-CM | POA: Insufficient documentation

## 2016-09-29 MED ORDER — DIAZEPAM 5 MG PO TABS: 2.5000 mg | ORAL_TABLET | Freq: Four times a day (QID) | ORAL | 0 refills | Status: DC | PRN

## 2016-09-29 MED ORDER — IBUPROFEN 800 MG PO TABS: 800.0000 mg | ORAL_TABLET | Freq: Three times a day (TID) | ORAL | 0 refills | Status: DC

## 2016-09-29 NOTE — Discharge Instructions (Signed)
You should follow up with her primary care physician this week if not improving. We think this is likely muscular pain.  However you may require an MRI or further imaging if not improving. Please return immediately if you have any numbness tingling or weakness.  Take food with the ibuprofen. In addition please only take the Valium in your home, do not drive any vehicles or take with alcohol.

## 2016-09-29 NOTE — ED Provider Notes (Signed)
[facility] DEPT Provider Note   CSN: [id number] Arrival date & time: 09/29/16  [id number]     History   Chief Complaint Chief Complaint  Patient presents with  . Shoulder Pain    right     HPI Jenny Obrien is a 51 y.o. female.  HPI   Patient is a pleasant 51 year old female presenting with right shoulder pain. Patient's shoulder pain started roughly 1 week ago. She reports that she went to an urgent care and had a negative x-ray. Patient was diagnosed with arthritis and called her physician who told her to take muscle relaxants which she had for another reason. She reports that the muscle relaxants have not helped her very much and she continues to have pain. She reports not being able to eat very much and therefore being very sleepy. She reports that her doctor wrote her out through Monday but she still having pain and does not feel like she can perform her job at work due to the pain.  Patient has noted no weakness no numbness no tingling no fevers no IV drug use.  Pain is located in the paraspinal C-spine, trapezius, right bicep. It is worse in the right bicep and she has pain with palpation. She reports taking 2 Bayer aspirin yesterday which improved her symptoms completely.  Past Medical History:  Diagnosis Date  . Abnormal Pap smear of cervix    yrs ago  . Migraine    cluster headaches  . Occipital neuralgia   . Sickle cell trait [facility])     Patient Active Problem List   Diagnosis Date Noted  . Dysphagia 09/16/2014  . Migraine, unspecified, without mention of intractable migraine without mention of status migrainosus 11/06/2012  . Occipital neuralgia     Past Surgical History:  Procedure Laterality Date  . CRYOTHERAPY     for abnormal paps yrs ago  . TUBAL LIGATION      OB History    Gravida Para Term Preterm AB Living   4 4 4     3    SAB TAB Ectopic Multiple Live Births           3      Obstetric Comments   Daughter was murdered       Home Medications     Prior to Admission medications   Medication Sig Start Date End Date Taking? Authorizing Provider  medroxyPROGESTERone (PROVERA) 10 MG tablet Take 1 tablet (10 mg total) by mouth daily. Patient not taking: Reported on 06/27/2016 09/03/15   Kem Boroughs, FNP  pantoprazole (PROTONIX) 40 MG tablet Take 40 mg by mouth daily.    Historical Provider, MD  SUMAtriptan (IMITREX) 50 MG tablet Take 1 tablet (50 mg total) by mouth every 2 (two) hours as needed for migraine. May repeat in 2 hours if headache persists or recurs. 11/20/14   Daleen Bo, MD    Family History Family History  Problem Relation Age of Onset  . Diabetes Mother   . Diverticulitis Mother   . Diabetes Sister   . Hypertension Sister   . Stroke Sister     Social History Social History  Substance Use Topics  . Smoking status: Never Smoker  . Smokeless tobacco: Never Used  . Alcohol use No     Allergies   Patient has no known allergies.   Review of Systems Review of Systems  Constitutional: Negative for activity change, fatigue and fever.  Respiratory: Negative for chest tightness and shortness of breath.   Cardiovascular:  Negative for chest pain.  Gastrointestinal: Negative for abdominal pain.  All other systems reviewed and are negative.    Physical Exam Updated Vital Signs BP 139/96 (BP Location: Right Arm)   Pulse 93   Temp 97.5 F (36.4 C) (Oral)   Resp 18   SpO2 99%   Physical Exam  Constitutional: She is oriented to person, place, and time. She appears well-developed and well-nourished.  HENT:  Head: Normocephalic and atraumatic.  Eyes: Right eye exhibits no discharge.  Cardiovascular: Normal rate, regular rhythm and normal heart sounds.   No murmur heard. Pulmonary/Chest: Effort normal and breath sounds normal. She has no wheezes. She has no rales.  Abdominal: Soft. She exhibits no distension. There is no tenderness.  Musculoskeletal:  Patient has 5 out of 5 strength in biceps triceps and  shoulder raise. She has normal sensation and good cap refill distally.. Patient does have tenderness to palpation over the biceps muscle belly. As well as trapezius muscle  Neurological: She is oriented to person, place, and time.  Skin: Skin is warm and dry. She is not diaphoretic.  Psychiatric: She has a normal mood and affect.  Nursing note and vitals reviewed.    ED Treatments / Results  Labs (all labs ordered are listed, but only abnormal results are displayed) Labs Reviewed - No data to display  EKG  EKG Interpretation None       Radiology No results found.  Procedures Procedures (including critical care time)  Medications Ordered in ED Medications - No data to display   Initial Impression / Assessment and Plan / ED Course  I have reviewed the triage vital signs and the nursing notes.  Pertinent labs & imaging results that were available during my care of the patient were reviewed by me and considered in my medical decision making (see chart for details).    Patient is a 51 year old female presenting with right arm pain. The presentation it sounds like this is musculoskeletal pain. I do not think it is likely to be acute cord, or any pathology requiring acute surgical intervention. This is given that she has normal strength and sensation. It resolved with Bayer aspirin yesterday. We will encourage her to use that if it helps. Otherwise we'll have her use ibuprofen and Valium to help with muscle relaxant , we have instructed her not to take these unless she is at home and not to mix with alcohol. Will give work note.   Final Clinical Impressions(s) / ED Diagnoses   Final diagnoses:  None    New Prescriptions New Prescriptions   No medications on file     Algie Cales Julio Alm, MD 09/29/16 [phone]

## 2016-09-29 NOTE — ED Triage Notes (Signed)
Pt reports right  shoulder pain , neck pain and upper back pain. sts was seen , EKG was unremarkable. Denies chest pain nor shortness of breath, sts xray were done and was Dx with arthriris. Pt reports persistent pain and no relief despite [pain meds. Alert and oriented x 4.

## 2016-09-30 ENCOUNTER — Ambulatory Visit
Admission: RE | Admit: 2016-09-30 | Discharge: 2016-09-30 | Disposition: A | Discharge status: Home / Self Care | Payer: BLUE CROSS/BLUE SHIELD | Source: Ambulatory Visit | Attending: Family Medicine | Admitting: Family Medicine

## 2016-09-30 DIAGNOSIS — Z1231 Encounter for screening mammogram for malignant neoplasm of breast: Secondary | ICD-10-CM

## 2016-10-04 ENCOUNTER — Emergency Department (HOSPITAL_COMMUNITY)
Admission: EM | Admit: 2016-10-04 | Discharge: 2016-10-04 | Disposition: A | Discharge status: Home / Self Care | Payer: BLUE CROSS/BLUE SHIELD | Attending: Emergency Medicine | Admitting: Emergency Medicine

## 2016-10-04 ENCOUNTER — Encounter (HOSPITAL_COMMUNITY): Payer: Self-pay | Admitting: Emergency Medicine

## 2016-10-04 DIAGNOSIS — G43109 Migraine with aura, not intractable, without status migrainosus: Secondary | ICD-10-CM | POA: Diagnosis not present

## 2016-10-04 DIAGNOSIS — M25511 Pain in right shoulder: Secondary | ICD-10-CM

## 2016-10-04 DIAGNOSIS — Z79899 Other long term (current) drug therapy: Secondary | ICD-10-CM | POA: Diagnosis not present

## 2016-10-04 MED ORDER — METOCLOPRAMIDE HCL 10 MG PO TABS
5.0000 mg | ORAL_TABLET | Freq: Once | ORAL | Status: AC
  Administered 2016-10-04: 5 mg via ORAL
  Filled 2016-10-04: qty 1

## 2016-10-04 MED ORDER — DIPHENHYDRAMINE HCL 25 MG PO CAPS
25.0000 mg | ORAL_CAPSULE | Freq: Once | ORAL | Status: AC
  Administered 2016-10-04: 25 mg via ORAL
  Filled 2016-10-04: qty 1

## 2016-10-04 MED ORDER — KETOROLAC TROMETHAMINE 15 MG/ML IJ SOLN
15.0000 mg | Freq: Once | INTRAMUSCULAR | Status: AC
  Administered 2016-10-04: 15 mg via INTRAMUSCULAR
  Filled 2016-10-04: qty 1

## 2016-10-04 NOTE — Discharge Instructions (Signed)
Please read attached information. If you experience any new or worsening signs or symptoms please return to the emergency room for evaluation. Please follow-up with your primary care provider or specialist as discussed.  °

## 2016-10-04 NOTE — ED Notes (Signed)
Patient was alert, oriented and stable upon discharge. RN went over AVS and patient had no further questions.  

## 2016-10-04 NOTE — ED Provider Notes (Signed)
[facility] DEPT Provider Note   CSN: [id number] Arrival date & time: 10/04/16  1143  By signing my name below, I, Jenny Obrien, attest that this documentation has been prepared under the direction and in the presence of [employer], PA-C . Electronically Signed: Sonum Obrien, Education administrator. 10/04/16. 2:41 PM.  History   Chief Complaint Chief Complaint  Patient presents with  . Migraine  . Shoulder Pain   The history is provided by the patient. No language interpreter was used.    HPI Comments: Jenny Obrien is a 51 y.o. female brought in by ambulance, who presents to the Emergency Department complaining of persistent, gradually worsened right shoulder and upper arm pain that began 2 weeks ago. She states the pain initially started after waking from sleep one morning. She notes certain positions relieve her pain while other positions worsen her pain. She has been treated with steroids, Vicodin, ibuprofen, muscle relaxants, and IcyHot cream without relief. She denies neck pain, extremity numbness or weakness. Patient had a negative x-ray at urgent care  She also complains of a HA that began this morning. She reports a history of migraines and states this feels similar. She has prescribed sumatriptan but has not been helpful. She denies numbness, weakness, paresthesia, photophobia.    Past Medical History:  Diagnosis Date  . Abnormal Pap smear of cervix    yrs ago  . Migraine    cluster headaches  . Occipital neuralgia   . Sickle cell trait [facility])     Patient Active Problem List   Diagnosis Date Noted  . Dysphagia 09/16/2014  . Migraine, unspecified, without mention of intractable migraine without mention of status migrainosus 11/06/2012  . Occipital neuralgia     Past Surgical History:  Procedure Laterality Date  . CRYOTHERAPY     for abnormal paps yrs ago  . TUBAL LIGATION      OB History    Gravida Para Term Preterm AB Living   4 4 4     3    SAB TAB Ectopic Multiple Live  Births           3      Obstetric Comments   Daughter was murdered       Home Medications    Prior to Admission medications   Medication Sig Start Date End Date Taking? Authorizing Provider  diazepam (VALIUM) 5 MG tablet Take 0.5 tablets (2.5 mg total) by mouth every 6 (six) hours as needed for muscle spasms. 09/29/16   Courteney Lyn Mackuen, MD  ibuprofen (ADVIL,MOTRIN) 800 MG tablet Take 1 tablet (800 mg total) by mouth 3 (three) times daily. 09/29/16   Courteney Lyn Mackuen, MD  medroxyPROGESTERone (PROVERA) 10 MG tablet Take 1 tablet (10 mg total) by mouth daily. Patient not taking: Reported on 06/27/2016 09/03/15   Kem Boroughs, FNP  pantoprazole (PROTONIX) 40 MG tablet Take 40 mg by mouth daily.    Historical Provider, MD  SUMAtriptan (IMITREX) 50 MG tablet Take 1 tablet (50 mg total) by mouth every 2 (two) hours as needed for migraine. May repeat in 2 hours if headache persists or recurs. 11/20/14   Daleen Bo, MD    Family History Family History  Problem Relation Age of Onset  . Diabetes Mother   . Diverticulitis Mother   . Diabetes Sister   . Hypertension Sister   . Stroke Sister     Social History Social History  Substance Use Topics  . Smoking status: Never Smoker  . Smokeless tobacco: Never  Used  . Alcohol use No     Allergies   Patient has no known allergies.   Review of Systems Review of Systems  Eyes: Negative for photophobia.  Musculoskeletal: Positive for arthralgias and myalgias. Negative for back pain, gait problem and neck pain.  Neurological: Positive for headaches. Negative for weakness and numbness.     Physical Exam Updated Vital Signs BP 132/82 (BP Location: Left Arm)   Pulse 93   Temp 98.2 F (36.8 C) (Oral)   Resp 18   SpO2 100%   Physical Exam  Constitutional: She is oriented to person, place, and time. She appears well-developed and well-nourished. No distress.  HENT:  Head: Normocephalic and atraumatic.  Eyes: Conjunctivae  and EOM are normal. Pupils are equal, round, and reactive to light. Right eye exhibits no discharge. Left eye exhibits no discharge. No scleral icterus.  Neck: Normal range of motion. Neck supple. No JVD present.  Cardiovascular: Normal rate.   Pulmonary/Chest: Effort normal. No stridor.  Musculoskeletal: Normal range of motion. She exhibits tenderness. She exhibits no edema or deformity.  Tenderness to palpation to right anterior proximal shoulder. No obvious swelling or warmth to touch. Pain with flexion of the shoulder. No pain with extension or  external/internal rotation. Negative empty can. Distal sensation intact. No crepitus noted with passive ROM. Tenderness to palpation of the right trapezius. No midline C and T spine tenderness.   Lymphadenopathy:    She has no cervical adenopathy.  Neurological: She is alert and oriented to person, place, and time. She has normal strength. She displays no atrophy and no tremor. No cranial nerve deficit or sensory deficit. She exhibits normal muscle tone. She displays a negative Romberg sign. She displays no seizure activity. Coordination and gait normal. GCS eye subscore is 4. GCS verbal subscore is 5. GCS motor subscore is 6.  Reflex Scores:      Patellar reflexes are 2+ on the right side and 2+ on the left side. Skin: Skin is warm and dry. She is not diaphoretic.  Psychiatric: She has a normal mood and affect.  Nursing note and vitals reviewed.    ED Treatments / Results  DIAGNOSTIC STUDIES:      COORDINATION OF CARE: 2:46 PM Discussed treatment plan with pt at bedside and pt agreed to plan.   Labs (all labs ordered are listed, but only abnormal results are displayed) Labs Reviewed - No data to display  EKG  EKG Interpretation None       Radiology No results found.  Procedures Procedures (including critical care time)  Medications Ordered in ED Medications  ketorolac (TORADOL) 15 MG/ML injection 15 mg (15 mg Intramuscular  Given 10/04/16 1528)  metoCLOPramide (REGLAN) tablet 5 mg (5 mg Oral Given 10/04/16 1528)  diphenhydrAMINE (BENADRYL) capsule 25 mg (25 mg Oral Given 10/04/16 1527)     Initial Impression / Assessment and Plan / ED Course  I have reviewed the triage vital signs and the nursing notes.  Pertinent labs & imaging results that were available during my care of the patient were reviewed by me and considered in my medical decision making (see chart for details).     Final Clinical Impressions(s) / ED Diagnoses   Final diagnoses:  Migraine with aura and without status migrainosus, not intractable  Acute pain of right shoulder    Labs:   Imaging:  Consults:  Therapeutics:Him a Reglan, Benadryl- sling  Discharge Meds:   Assessment/Plan: 51 year old female presents today with numerous complaints. Patient  having right shoulder pain worse with flexion, no signs of infectious etiology. Patient has no neurological deficits, no significant spinal process. Patient will be placed in a sling and referred to orthopedics for evaluation. Patient also having a migraine typical of previous, no red flags. Treated here with symptomatic improvement. Discharged home with return precautions, follow-up information given. She verbalized understanding and agreement to today's plan had no further questions or concerns      New Prescriptions Discharge Medication List as of 10/04/2016  4:16 PM     I personally performed the services described in this documentation, which was scribed in my presence. The recorded information has been reviewed and is accurate.    Okey Regal, PA-C 10/04/16 [place], MD 10/05/16 [phone]

## 2016-10-04 NOTE — ED Triage Notes (Signed)
Per EMS-states chronic right shoulder pain for months-has been worked up in past-had a doctors appointment today but decided to come here instead-also having a headache

## 2016-10-07 ENCOUNTER — Encounter (INDEPENDENT_AMBULATORY_CARE_PROVIDER_SITE_OTHER): Payer: Self-pay | Admitting: Orthopedic Surgery

## 2016-10-07 ENCOUNTER — Ambulatory Visit (INDEPENDENT_AMBULATORY_CARE_PROVIDER_SITE_OTHER): Payer: BLUE CROSS/BLUE SHIELD | Admitting: Orthopedic Surgery

## 2016-10-07 VITALS — Ht 65.75 in | Wt 165.0 lb

## 2016-10-07 DIAGNOSIS — M25511 Pain in right shoulder: Secondary | ICD-10-CM

## 2016-10-07 MED ORDER — METHYLPREDNISOLONE ACETATE 40 MG/ML IJ SUSP
40.0000 mg | INTRAMUSCULAR | Status: AC | PRN
  Administered 2016-10-07: 40 mg via INTRA_ARTICULAR

## 2016-10-07 MED ORDER — LIDOCAINE HCL 1 % IJ SOLN
5.0000 mL | INTRAMUSCULAR | Status: AC | PRN
  Administered 2016-10-07: 5 mL

## 2016-10-07 NOTE — Progress Notes (Addendum)
Office Visit Note   Patient: Jenny Obrien           Date of Birth: 1965/11/04           MRN: [id number] Visit Date: 10/07/2016              Requested by: No referring provider defined for this encounter. PCP: No PCP Per Patient  Chief Complaint  Patient presents with  . Right Shoulder - Pain    HPI: Patient is a 51 y.o female who presents today for right shoulder and neck pain. She was initally evaluated at a [facility] and subsequently seen at [facility]. She has no known injury. She states her pain began on February 5th. She states her neck pain has improved. Continues to have constant aching pain in bicep and anterior shoulder. No radicular symptoms. No weakness. She is in a shoulder immobilizer today.       Assessment & Plan: Visit Diagnoses:  1. Acute pain of right shoulder     Plan: Probable rotator cuff tear. Will try injection today for impingement and pain. Patient to follow up in office in 4 weeks if continued pain and symptoms. May need to proceed with MRI. Advised may return to work. Works as a Secretary/administrator. Advised to hold on above head work at this time. She will discontinue the sling.   Follow-Up Instructions: Return in about 4 weeks (around 11/04/2016), or if symptoms worsen or fail to improve.   Physical Exam  Constitutional: Appears well-developed.  Head: Normocephalic.  Eyes: EOM are normal.  Neck: Normal range of motion.  Cardiovascular: Normal rate.   Pulmonary/Chest: Effort normal.  Neurological: Is alert.  Skin: Skin is warm.  Psychiatric: Has a normal mood and affect.  Right Shoulder Exam   Tenderness  The patient is experiencing tenderness in the biceps tendon.  Range of Motion  Active Abduction: 90  Passive Abduction: normal  External Rotation: normal   Tests  Drop Arm: positive Hawkin's test: negative      Imaging: No results found.  Orders:  No orders of the defined types were placed in this encounter.  No orders of the  defined types were placed in this encounter.    Procedures: Large Joint Inj Date/Time: 10/07/2016 1:31 PM Performed by: Suzan Slick Authorized by: Dondra Prader R   Consent Given by:  Patient Site marked: the procedure site was marked   Timeout: prior to procedure the correct patient, procedure, and site was verified   Indications:  Pain and diagnostic evaluation Location:  Shoulder Site:  R subacromial bursa Prep: patient was prepped and draped in usual sterile fashion   Needle Size:  22 G Needle Length:  1.5 inches Ultrasound Guidance: No   Fluoroscopic Guidance: No   Arthrogram: No   Medications:  5 mL lidocaine 1 %; 40 mg methylPREDNISolone acetate 40 MG/ML Aspiration Attempted: No   Patient tolerance:  Patient tolerated the procedure well with no immediate complications    Clinical Data: No additional findings.  Subjective: Review of Systems  Constitutional: Negative for chills and fever.  Musculoskeletal: Positive for arthralgias and myalgias. Negative for neck pain.    Objective: Vital Signs: Ht 5' 5.75" (1.67 m)   Wt 165 lb (74.8 kg)   BMI 26.83 kg/m   Specialty Comments:  No specialty comments available.  PMFS History: Patient Active Problem List   Diagnosis Date Noted  . Dysphagia 09/16/2014  . Migraine, unspecified, without mention of intractable migraine without  mention of status migrainosus 11/06/2012  . Occipital neuralgia    Past Medical History:  Diagnosis Date  . Abnormal Pap smear of cervix    yrs ago  . Migraine    cluster headaches  . Occipital neuralgia   . Sickle cell trait (HCC)     Family History  Problem Relation Age of Onset  . Diabetes Mother   . Diverticulitis Mother   . Diabetes Sister   . Hypertension Sister   . Stroke Sister     Past Surgical History:  Procedure Laterality Date  . CRYOTHERAPY     for abnormal paps yrs ago  . TUBAL LIGATION     Social History   Occupational History  . Not on file.   Social  History Main Topics  . Smoking status: Never Smoker  . Smokeless tobacco: Never Used  . Alcohol use No  . Drug use: No  . Sexual activity: Yes    Partners: Male    Birth control/ protection: Surgical     Comment: BTL

## 2017-09-22 ENCOUNTER — Other Ambulatory Visit: Payer: Self-pay | Admitting: Family Medicine

## 2018-01-30 ENCOUNTER — Emergency Department (HOSPITAL_COMMUNITY)
Admission: EM | Admit: 2018-01-30 | Discharge: 2018-01-30 | Disposition: A | Discharge status: Home / Self Care | Payer: BLUE CROSS/BLUE SHIELD | Attending: Emergency Medicine | Admitting: Emergency Medicine

## 2018-01-30 ENCOUNTER — Emergency Department (HOSPITAL_COMMUNITY): Payer: BLUE CROSS/BLUE SHIELD

## 2018-01-30 ENCOUNTER — Encounter (HOSPITAL_COMMUNITY): Payer: Self-pay | Admitting: Emergency Medicine

## 2018-01-30 DIAGNOSIS — M79652 Pain in left thigh: Secondary | ICD-10-CM

## 2018-01-30 DIAGNOSIS — R079 Chest pain, unspecified: Secondary | ICD-10-CM | POA: Diagnosis not present

## 2018-01-30 DIAGNOSIS — Z79899 Other long term (current) drug therapy: Secondary | ICD-10-CM | POA: Insufficient documentation

## 2018-01-30 LAB — BASIC METABOLIC PANEL
Anion gap: 7 (ref 5–15)
BUN: 13 mg/dL (ref 6–20)
CALCIUM: 9.5 mg/dL (ref 8.9–10.3)
CHLORIDE: 105 mmol/L (ref 101–111)
CO2: 31 mmol/L (ref 22–32)
CREATININE: 0.97 mg/dL (ref 0.44–1.00)
GFR calc Af Amer: >60 mL/min (ref >60)
GFR calc non Af Amer: >60 mL/min (ref >60)
Glucose, Bld: 92 mg/dL (ref 65–99)
Potassium: 3.7 mmol/L (ref 3.5–5.1)
Sodium: 143 mmol/L (ref 135–145)

## 2018-01-30 LAB — I-STAT TROPONIN, ED: TROPONIN I, POC: 0 ng/mL (ref 0.00–0.08)

## 2018-01-30 LAB — I-STAT BETA HCG BLOOD, ED (MC, WL, AP ONLY): I-stat hCG, quantitative: <5 m[IU]/mL (ref <5)

## 2018-01-30 LAB — CBC
HCT: 36 % (ref 36.0–46.0)
Hemoglobin: 12.6 g/dL (ref 12.0–15.0)
MCH: 28.5 pg (ref 26.0–34.0)
MCHC: 35 g/dL (ref 30.0–36.0)
MCV: 81.4 fL (ref 78.0–100.0)
PLATELETS: 188 10*3/uL (ref 150–400)
RBC: 4.42 MIL/uL (ref 3.87–5.11)
RDW: 15.1 % (ref 11.5–15.5)
WBC: 6.6 10*3/uL (ref 4.0–10.5)

## 2018-01-30 MED ORDER — OMEPRAZOLE 20 MG PO CPDR: 20.0000 mg | DELAYED_RELEASE_CAPSULE | Freq: Every day | ORAL | 0 refills | Status: AC

## 2018-01-30 NOTE — Discharge Instructions (Signed)
Follow up with your primary care Doctor.

## 2018-01-30 NOTE — ED Provider Notes (Signed)
[place] DEPT Provider Note   CSN: [id number] Arrival date & time: 01/30/18  1225     History   Chief Complaint Chief Complaint  Patient presents with  . Chest Pain  . Leg Pain    HPI Jenny Obrien is a 52 y.o. female.  HPI Patient presents with episode of chest pain.  Has had 3 different episodes of sharp left-sided chest pain.  Each lasted under a minute.  They were sharp.  States were rather severe.  On the left parasternal area.  No shortness of breath.  States for the last few weeks she has had some pain in her left thigh.  No swelling.  Worse with certain positions.  Has not had chest pain or thigh pain like this before.  No fevers.  No nausea or vomiting.  No diaphoresis.  States she is under a lot of stress because it is coming up on the anniversary of when 1 of her daughters died. Past Medical History:  Diagnosis Date  . Abnormal Pap smear of cervix    yrs ago  . Migraine    cluster headaches  . Occipital neuralgia   . Sickle cell trait [facility])     Patient Active Problem List   Diagnosis Date Noted  . Dysphagia 09/16/2014  . Migraine, unspecified, without mention of intractable migraine without mention of status migrainosus 11/06/2012  . Occipital neuralgia     Past Surgical History:  Procedure Laterality Date  . CRYOTHERAPY     for abnormal paps yrs ago  . TUBAL LIGATION       OB History    Gravida  4   Para  4   Term  4   Preterm      AB      Living  3     SAB      TAB      Ectopic      Multiple      Live Births  3        Obstetric Comments  Daughter was murdered         Home Medications    Prior to Admission medications   Medication Sig Start Date End Date Taking? Authorizing Provider  cyclobenzaprine (FLEXERIL) 10 MG tablet  08/20/16   [provider]  diazepam (VALIUM) 5 MG tablet Take 0.5 tablets (2.5 mg total) by mouth every 6 (six) hours as needed for muscle spasms. 09/29/16    Mackuen, Courteney Lyn, MD  ibuprofen (ADVIL,MOTRIN) 800 MG tablet Take 1 tablet (800 mg total) by mouth 3 (three) times daily. 09/29/16   Mackuen, Courteney Lyn, MD  medroxyPROGESTERone (PROVERA) 10 MG tablet Take 1 tablet (10 mg total) by mouth daily. Patient not taking: Reported on 06/27/2016 09/03/15   Kem Boroughs, FNP  omeprazole (PRILOSEC) 20 MG capsule Take 1 capsule (20 mg total) by mouth daily. 01/30/18   Davonna Belling, MD  pantoprazole (PROTONIX) 40 MG tablet Take 40 mg by mouth daily.    [provider]  predniSONE (STERAPRED UNI-PAK 21 TAB) 5 MG (21) TBPK tablet TK AS DIRECTED. START ON [facility]TH 09/21/16   [provider]  SUMAtriptan (IMITREX) 50 MG tablet Take 1 tablet (50 mg total) by mouth every 2 (two) hours as needed for migraine. May repeat in 2 hours if headache persists or recurs. 11/20/14   Daleen Bo, MD    Family History Family History  Problem Relation Age of Onset  . Diabetes Mother   .  Diverticulitis Mother   . Diabetes Sister   . Hypertension Sister   . Stroke Sister     Social History Social History   Tobacco Use  . Smoking status: Never Smoker  . Smokeless tobacco: Never Used  Substance Use Topics  . Alcohol use: No    Alcohol/week: 0.0 oz  . Drug use: No     Allergies   Patient has no known allergies.   Review of Systems Review of Systems  Constitutional: Negative for appetite change.  HENT: Negative for congestion.   Respiratory: Negative for cough and shortness of breath.   Cardiovascular: Positive for chest pain. Negative for leg swelling.  Gastrointestinal: Negative for abdominal distention.  Genitourinary: Negative for dysuria.  Musculoskeletal: Negative for back pain.  Skin: Negative for rash.  Neurological: Negative for seizures.  Hematological: Negative for adenopathy.  Psychiatric/Behavioral: Negative for confusion.     Physical Exam Updated Vital Signs BP 131/80 (BP Location: Left Arm)    Pulse 72   Temp 98.4 F (36.9 C) (Oral)   Resp 18   Ht 5\' 5"  (1.651 m)   Wt 79.4 kg (175 lb)   SpO2 98%   BMI 29.12 kg/m   Physical Exam  Constitutional: She appears well-developed.  HENT:  Head: Atraumatic.  Eyes: Pupils are equal, round, and reactive to light.  Neck: Neck supple.  Cardiovascular: Normal rate, regular rhythm and normal pulses.  Pulmonary/Chest: Effort normal and breath sounds normal.  Musculoskeletal:       Right lower leg: She exhibits no tenderness and no edema.       Left lower leg: She exhibits no tenderness and no edema.  Dorsalis pedis pulse intact.  Neurological: She is alert.  Skin: Capillary refill takes less than 2 seconds.     ED Treatments / Results  Labs (all labs ordered are listed, but only abnormal results are displayed) Labs Reviewed  BASIC METABOLIC PANEL  CBC  I-STAT TROPONIN, ED  I-STAT BETA HCG BLOOD, ED ([facility], [facility], [facility] ONLY)    EKG EKG Interpretation  Date/Time:  Monday January 30 2018 12:36:10 EDT Ventricular Rate:  84 PR Interval:    QRS Duration: 63 QT Interval:  364 QTC Calculation: 431 R Axis:   72 Text Interpretation:  Sinus rhythm Confirmed by Davonna Belling [phone]) on 01/30/2018 6:42:12 PM   Radiology Dg Chest 2 View  Result Date: 01/30/2018 CLINICAL DATA:  Chest pain EXAM: CHEST - 2 VIEW COMPARISON:  February 09, 2015 FINDINGS: Lungs are clear. Heart size and pulmonary vascularity are normal. No adenopathy. No evident bone lesions. No pneumothorax. IMPRESSION: No edema or consolidation. Electronically Signed   By: Lowella Grip III M.D.   On: 01/30/2018 13:22    Procedures Procedures (including critical care time)  Medications Ordered in ED Medications - No data to display   Initial Impression / Assessment and Plan / ED Course  I have reviewed the triage vital signs and the nursing notes.  Pertinent labs & imaging results that were available during my care of the patient were reviewed by me and considered in  my medical decision making (see chart for details).     Patient with sharp left chest pain.  Resolved.  Each episode only lasted under a minute.  No tenderness or edema on the thigh.  No tachycardia.  States she has had some pain with eating also.  Occasional left upper quadrant tenderness.  Will start on Prilosec and follow-up with PCP as needed.  This does not  appear to be a cardiac cause.  Also doubt pulmonary embolism.  Final Clinical Impressions(s) / ED Diagnoses   Final diagnoses:  Nonspecific chest pain  Pain of left thigh    ED Discharge Orders        Ordered    omeprazole (PRILOSEC) 20 MG capsule  Daily     01/30/18 1851       Davonna Belling, MD 01/30/18 1905

## 2018-01-30 NOTE — ED Triage Notes (Signed)
Pt reports today had three back to back episodes of left side chest pains that lasted about 45 seconds to minute.  Pt then reports had burning sensation in left leg pain for about 2-3 weeks. Pain is worse with laying on it.

## 2018-04-12 ENCOUNTER — Telehealth: Payer: Self-pay | Admitting: Certified Nurse Midwife

## 2018-04-12 DIAGNOSIS — R9389 Abnormal findings on diagnostic imaging of other specified body structures: Secondary | ICD-10-CM

## 2018-04-12 NOTE — Telephone Encounter (Signed)
Spoke with patient. Advised per Dr. Talbert Nan. Patient states she has not had a cycle in 1.5 -3 yrs, denies any vaginal bleeding. PUS scheduled for 9/3 at 3pm, consult to follow at 3:30pm with Dr. Talbert Nan. Declined PUS offered of 8/29. Order placed for precert, patient aware will be called with benefits. Patient will schedule AEX while in office.   Routing to provider for final review. Patient is agreeable to disposition. Will close encounter.   Cc: Melvia Heaps, CNM, Lerry Liner

## 2018-04-12 NOTE — Telephone Encounter (Signed)
Patient called and stated that she had a CT scan and was told to follow up with her GYN for endometriosis.

## 2018-04-12 NOTE — Telephone Encounter (Signed)
Spoke with patient. Patient states she was seen by her GI on 04/11/18 for "stomach issues and weight loss", was sent to [facility] for CT scan. Patient was advised today she needs to f/u with GYN for "possible endometriosis". Patient request to schedule OV.   Advised last AEX 08/28/15 with Melvia Heaps, CNM. Need to review with MD and return call to schedule. Patient verbalizes understanding.   Dr. Talbert Nan -please review CT abd/pelvis in Care Everywhere, and advise. Patient is overdue for AEX.   Cc: Melvia Heaps, CNM

## 2018-04-12 NOTE — Telephone Encounter (Signed)
Left message to call Sharee Pimple at [phone].   Reviewed with Dr. Talbert Nan, PUS for further evaluation.

## 2018-04-12 NOTE — Telephone Encounter (Signed)
Left message to call Sharee Pimple at [phone].   CT abdomen/pelvis at [facility] on 04/11/18, report available in Care Everywhere.   Last AEX 08/28/15 with DL

## 2018-04-18 ENCOUNTER — Encounter: Payer: Self-pay | Admitting: Obstetrics and Gynecology

## 2018-04-18 ENCOUNTER — Other Ambulatory Visit: Payer: Self-pay

## 2018-04-18 ENCOUNTER — Ambulatory Visit (INDEPENDENT_AMBULATORY_CARE_PROVIDER_SITE_OTHER): Payer: BLUE CROSS/BLUE SHIELD | Admitting: Obstetrics and Gynecology

## 2018-04-18 ENCOUNTER — Other Ambulatory Visit: Payer: Self-pay | Admitting: Obstetrics and Gynecology

## 2018-04-18 ENCOUNTER — Ambulatory Visit (INDEPENDENT_AMBULATORY_CARE_PROVIDER_SITE_OTHER): Payer: BLUE CROSS/BLUE SHIELD

## 2018-04-18 VITALS — BP 112/76 | HR 82 | Resp 12 | Ht 66.0 in | Wt 161.5 lb

## 2018-04-18 DIAGNOSIS — N9489 Other specified conditions associated with female genital organs and menstrual cycle: Secondary | ICD-10-CM

## 2018-04-18 DIAGNOSIS — D251 Intramural leiomyoma of uterus: Secondary | ICD-10-CM

## 2018-04-18 DIAGNOSIS — N882 Stricture and stenosis of cervix uteri: Secondary | ICD-10-CM

## 2018-04-18 DIAGNOSIS — D250 Submucous leiomyoma of uterus: Secondary | ICD-10-CM

## 2018-04-18 DIAGNOSIS — R9389 Abnormal findings on diagnostic imaging of other specified body structures: Secondary | ICD-10-CM | POA: Diagnosis not present

## 2018-04-18 NOTE — Progress Notes (Signed)
GYNECOLOGY  VISIT   HPI: 52 y.o.   Married  [place] American  female   [phone] with No LMP recorded. (Menstrual status: Perimenopausal).   here for   Pelvic ultrasound. The patient had an abdominal pelvic CT secondary to pain. Incidentally she was noted to have possible endometrial thickening. Further evaluation was recommended.  She is PMP and denies any bleeding in at least 1.5 years.   GYNECOLOGIC HISTORY: No LMP recorded. (Menstrual status: Perimenopausal). Contraception: BTL  Menopausal hormone therapy: none         OB History    Gravida  4   Para  4   Term  4   Preterm      AB      Living  3     SAB      TAB      Ectopic      Multiple      Live Births  3        Obstetric Comments  Daughter was murdered           Patient Active Problem List   Diagnosis Date Noted  . Dysphagia 09/16/2014  . Migraine, unspecified, without mention of intractable migraine without mention of status migrainosus 11/06/2012  . Occipital neuralgia     Past Medical History:  Diagnosis Date  . Abnormal Pap smear of cervix    yrs ago  . Migraine    cluster headaches  . Occipital neuralgia   . Sickle cell trait [facility])     Past Surgical History:  Procedure Laterality Date  . CRYOTHERAPY     for abnormal paps yrs ago  . TUBAL LIGATION      Current Outpatient Medications  Medication Sig Dispense Refill  . cyclobenzaprine (FLEXERIL) 10 MG tablet   0  . diazepam (VALIUM) 5 MG tablet Take 0.5 tablets (2.5 mg total) by mouth every 6 (six) hours as needed for muscle spasms. 5 tablet 0  . ibuprofen (ADVIL,MOTRIN) 800 MG tablet Take 1 tablet (800 mg total) by mouth 3 (three) times daily. 21 tablet 0  . omeprazole (PRILOSEC) 20 MG capsule Take 1 capsule (20 mg total) by mouth daily. 14 capsule 0  . pantoprazole (PROTONIX) 40 MG tablet Take 40 mg by mouth daily.    . predniSONE (STERAPRED UNI-PAK 21 TAB) 5 MG (21) TBPK tablet TK AS DIRECTED. START ON WEDNESDAY THE 7TH  0    . ranitidine (ZANTAC) 300 MG tablet Take by mouth.    . SUMAtriptan (IMITREX) 50 MG tablet Take 1 tablet (50 mg total) by mouth every 2 (two) hours as needed for migraine. May repeat in 2 hours if headache persists or recurs. 10 tablet 0   No current facility-administered medications for this visit.      ALLERGIES: Patient has no known allergies.  Family History  Problem Relation Age of Onset  . Diabetes Mother   . Diverticulitis Mother   . Diabetes Sister   . Hypertension Sister   . Stroke Sister     Social History   Socioeconomic History  . Marital status: Married    Spouse name: Not on file  . Number of children: Not on file  . Years of education: Not on file  . Highest education level: Not on file  Occupational History  . Not on file  Social Needs  . Financial resource strain: Not on file  . Food insecurity:    Worry: Not on file    Inability: Not  on file  . Transportation needs:    Medical: Not on file    Non-medical: Not on file  Tobacco Use  . Smoking status: Never Smoker  . Smokeless tobacco: Never Used  Substance and Sexual Activity  . Alcohol use: No    Alcohol/week: 0.0 standard drinks  . Drug use: No  . Sexual activity: Yes    Partners: Male    Birth control/protection: Surgical    Comment: BTL  Lifestyle  . Physical activity:    Days per week: Not on file    Minutes per session: Not on file  . Stress: Not on file  Relationships  . Social connections:    Talks on phone: Not on file    Gets together: Not on file    Attends religious service: Not on file    Active member of club or organization: Not on file    Attends meetings of clubs or organizations: Not on file    Relationship status: Not on file  . Intimate partner violence:    Fear of current or ex partner: Not on file    Emotionally abused: Not on file    Physically abused: Not on file    Forced sexual activity: Not on file  Other Topics Concern  . Not on file  Social History  Narrative  . Not on file    Review of Systems  Constitutional: Positive for weight loss.  HENT: Negative.   Eyes: Negative.   Respiratory: Negative.   Cardiovascular: Positive for chest pain.  Gastrointestinal: Negative.   Genitourinary: Negative.   Musculoskeletal: Negative.   Skin: Negative.   Neurological: Negative.   Endo/Heme/Allergies: Negative.   Psychiatric/Behavioral: Negative.     PHYSICAL EXAMINATION:    BP 112/76 (BP Location: Right Arm, Patient Position: Sitting, Cuff Size: Normal)   Pulse 82   Resp 12   Ht 5\' 6"  (1.676 m)   Wt 161 lb 8 oz (73.3 kg)   BMI 26.07 kg/m     General appearance: alert, cooperative and appears stated age  Pelvic: External genitalia:  no lesions              Urethra:  normal appearing urethra with no masses, tenderness or lesions              Bartholins and Skenes: normal                 Vagina: normal appearing vagina with normal color and discharge, no lesions              Cervix:stenotic  Chaperone was present for exam.  Ultrasound images were reviewed with the patient.   Sonohysterogram The procedure and risks of the procedure were reviewed with the patient, consent form was signed. A speculum was placed in the vagina and the cervix was cleansed with betadine. A tenaculum was placed and the cervix was dilated with mini-dilators. The sonohysterogram catheter was then inserted into the uterine cavity without difficulty and the tenaculum and speculum were removed. Saline was infused under direct observation with the ultrasound. 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 The catheter was removed.      ASSESSMENT Incidental finding of possible endometrial thickening on exam  Sonohysterogram c/w intracavitary myoma, type 1    PLAN Patient reassured, no further treatment unless she has bleeding   An After Visit Summary was printed and given to the patient.

## 2018-10-03 ENCOUNTER — Encounter (HOSPITAL_COMMUNITY): Payer: Self-pay

## 2018-10-03 DIAGNOSIS — Z79899 Other long term (current) drug therapy: Secondary | ICD-10-CM | POA: Diagnosis not present

## 2018-10-03 DIAGNOSIS — K219 Gastro-esophageal reflux disease without esophagitis: Secondary | ICD-10-CM | POA: Diagnosis not present

## 2018-10-03 DIAGNOSIS — R079 Chest pain, unspecified: Secondary | ICD-10-CM | POA: Diagnosis present

## 2018-10-03 MED ORDER — SODIUM CHLORIDE 0.9% FLUSH: 3.0000 mL | Freq: Once | INTRAVENOUS | Status: DC

## 2018-10-03 NOTE — ED Triage Notes (Signed)
Pt states she has had intermittent chest pain for the last hour in her central chest, non radiating pain. Denies any shortness of breath, dizziness, or suggestive history of cardiac problems. States she has medication for heart burn.

## 2018-10-04 ENCOUNTER — Emergency Department (HOSPITAL_COMMUNITY): Payer: BLUE CROSS/BLUE SHIELD

## 2018-10-04 ENCOUNTER — Emergency Department (HOSPITAL_COMMUNITY)
Admission: EM | Admit: 2018-10-04 | Discharge: 2018-10-04 | Disposition: A | Discharge status: Home / Self Care | Payer: BLUE CROSS/BLUE SHIELD | Attending: Emergency Medicine | Admitting: Emergency Medicine

## 2018-10-04 DIAGNOSIS — R079 Chest pain, unspecified: Secondary | ICD-10-CM

## 2018-10-04 DIAGNOSIS — K219 Gastro-esophageal reflux disease without esophagitis: Secondary | ICD-10-CM

## 2018-10-04 LAB — BASIC METABOLIC PANEL
Anion gap: 8 (ref 5–15)
BUN: 14 mg/dL (ref 6–20)
CALCIUM: 9.2 mg/dL (ref 8.9–10.3)
CO2: 25 mmol/L (ref 22–32)
Chloride: 105 mmol/L (ref 98–111)
Creatinine, Ser: 0.76 mg/dL (ref 0.44–1.00)
GFR calc Af Amer: >60 mL/min (ref >60)
GFR calc non Af Amer: >60 mL/min (ref >60)
Glucose, Bld: 115 mg/dL — ABNORMAL HIGH (ref 70–99)
Potassium: 3.5 mmol/L (ref 3.5–5.1)
Sodium: 138 mmol/L (ref 135–145)

## 2018-10-04 LAB — CBC
HCT: 36.8 % (ref 36.0–46.0)
Hemoglobin: 12.6 g/dL (ref 12.0–15.0)
MCH: 28 pg (ref 26.0–34.0)
MCHC: 34.2 g/dL (ref 30.0–36.0)
MCV: 81.8 fL (ref 80.0–100.0)
PLATELETS: 187 10*3/uL (ref 150–400)
RBC: 4.5 MIL/uL (ref 3.87–5.11)
RDW: 14.5 % (ref 11.5–15.5)
WBC: 7.2 10*3/uL (ref 4.0–10.5)
nRBC: 0 % (ref 0.0–0.2)

## 2018-10-04 LAB — I-STAT TROPONIN, ED
TROPONIN I, POC: 0 ng/mL (ref 0.00–0.08)
Troponin i, poc: 0 ng/mL (ref 0.00–0.08)

## 2018-10-04 LAB — I-STAT BETA HCG BLOOD, ED (MC, WL, AP ONLY): I-stat hCG, quantitative: <5 m[IU]/mL (ref <5)

## 2018-10-04 MED ORDER — ALUM & MAG HYDROXIDE-SIMETH 200-200-20 MG/5ML PO SUSP
30.0000 mL | Freq: Once | ORAL | Status: AC
  Administered 2018-10-04: 30 mL via ORAL
  Filled 2018-10-04: qty 30

## 2018-10-04 NOTE — ED Provider Notes (Signed)
[place] DEPT Provider Note  CSN: [id number] Arrival date & time: 10/03/18 2319  Chief Complaint(s) Chest Pain  HPI Jenny Obrien is a 53 y.o. female   The history is provided by the patient.  Chest Pain  Pain location:  Substernal area Pain quality: stabbing   Radiates to: migrating across multiple areas on chest. Pain severity:  Severe Onset quality:  Gradual Duration:  8 hours Timing:  Intermittent Progression:  Resolved Chronicity:  New Relieved by: lying on right side. Exacerbated by: lying on left side. Ineffective treatments: protonix. Associated symptoms: heartburn (reports feeling acid come up into her throat)   Associated symptoms: no anorexia, no anxiety, no cough, no diaphoresis, no fatigue, no nausea, no shortness of breath, no vomiting and no weakness   Risk factors: no coronary artery disease, no diabetes mellitus, no high cholesterol, no hypertension and no smoking    Reports prior esophageal dilatation, last time was 1.5 yrs ago.  Past Medical History Past Medical History:  Diagnosis Date  . Abnormal Pap smear of cervix    yrs ago  . Migraine    cluster headaches  . Occipital neuralgia   . Sickle cell trait [facility])    Patient Active Problem List   Diagnosis Date Noted  . Dysphagia 09/16/2014  . Migraine, unspecified, without mention of intractable migraine without mention of status migrainosus 11/06/2012  . Occipital neuralgia    Home Medication(s) Prior to Admission medications   Medication Sig Start Date End Date Taking? Authorizing Provider  omeprazole (PRILOSEC) 20 MG capsule Take 1 capsule (20 mg total) by mouth daily. 01/30/18  Yes Davonna Belling, MD  pantoprazole (PROTONIX) 40 MG tablet Take 40 mg by mouth daily.   Yes [provider]  SUMAtriptan (IMITREX) 50 MG tablet Take 1 tablet (50 mg total) by mouth every 2 (two) hours as needed for migraine. May repeat in 2 hours if headache persists or recurs.  11/20/14  Yes Daleen Bo, MD  diazepam (VALIUM) 5 MG tablet Take 0.5 tablets (2.5 mg total) by mouth every 6 (six) hours as needed for muscle spasms. Patient not taking: Reported on 10/04/2018 09/29/16   Mackuen, Courteney Lyn, MD  ibuprofen (ADVIL,MOTRIN) 800 MG tablet Take 1 tablet (800 mg total) by mouth 3 (three) times daily. Patient not taking: Reported on 10/04/2018 09/29/16   Macarthur Critchley, MD                                                                                                                                    Past Surgical History Past Surgical History:  Procedure Laterality Date  . CRYOTHERAPY     for abnormal paps yrs ago  . TUBAL LIGATION     Family History Family History  Problem Relation Age of Onset  . Diabetes Mother   . Diverticulitis Mother   . Diabetes Sister   . Hypertension Sister   . Stroke  Sister     Social History Social History   Tobacco Use  . Smoking status: Never Smoker  . Smokeless tobacco: Never Used  Substance Use Topics  . Alcohol use: No    Alcohol/week: 0.0 standard drinks  . Drug use: No   Allergies Patient has no known allergies.  Review of Systems Review of Systems  Constitutional: Negative for diaphoresis and fatigue.  Respiratory: Negative for cough and shortness of breath.   Cardiovascular: Positive for chest pain.  Gastrointestinal: Positive for heartburn (reports feeling acid come up into her throat). Negative for anorexia, nausea and vomiting.  Neurological: Negative for weakness.   All other systems are reviewed and are negative for acute change except as noted in the HPI  Physical Exam Vital Signs  I have reviewed the triage vital signs BP 107/63   Pulse 68   Temp 97.7 F (36.5 C) (Oral)   Resp 14   SpO2 98%   Physical Exam Vitals signs reviewed.  Constitutional:      General: She is not in acute distress.    Appearance: She is well-developed. She is not diaphoretic.  HENT:     Head:  Normocephalic and atraumatic.     Nose: Nose normal.  Eyes:     General: No scleral icterus.       Right eye: No discharge.        Left eye: No discharge.     Conjunctiva/sclera: Conjunctivae normal.     Pupils: Pupils are equal, round, and reactive to light.  Neck:     Musculoskeletal: Normal range of motion and neck supple.  Cardiovascular:     Rate and Rhythm: Normal rate and regular rhythm.     Heart sounds: No murmur. No friction rub. No gallop.   Pulmonary:     Effort: Pulmonary effort is normal. No respiratory distress.     Breath sounds: Normal breath sounds. No stridor. No rales.  Abdominal:     General: There is no distension.     Palpations: Abdomen is soft.     Tenderness: There is no abdominal tenderness.  Musculoskeletal:        General: No tenderness.  Skin:    General: Skin is warm and dry.     Findings: No erythema or rash.  Neurological:     Mental Status: She is alert and oriented to person, place, and time.     ED Results and Treatments Labs (all labs ordered are listed, but only abnormal results are displayed) Labs Reviewed  BASIC METABOLIC PANEL - Abnormal; Notable for the following components:      Result Value   Glucose, Bld 115 (*)    All other components within normal limits  CBC  I-STAT TROPONIN, ED  I-STAT BETA HCG BLOOD, ED ([facility], [facility], [facility] ONLY)  I-STAT TROPONIN, ED  EKG  EKG Interpretation  Date/Time:  Tuesday October 03 2018 23:28:47 EST Ventricular Rate:  75 PR Interval:    QRS Duration: 89 QT Interval:  377 QTC Calculation: 421 R Axis:   38 Text Interpretation:  Sinus rhythm since last tracing no significant change Confirmed by Daleen Bo [phone]) on 10/03/2018 11:44:16 PM Also confirmed by Daleen Bo [phone]), editor Philomena Doheny [phone])  on 10/04/2018 7:10:38 AM      Radiology Dg Chest 2 View  Result  Date: 10/04/2018 CLINICAL DATA:  Chest pain EXAM: CHEST - 2 VIEW COMPARISON:  01/30/2018 FINDINGS: Normal heart size and mediastinal contours. No acute infiltrate or edema. No effusion or pneumothorax. No acute osseous findings. Cholecystectomy clips. IMPRESSION: Negative chest. Electronically Signed   By: Monte Fantasia M.D.   On: 10/04/2018 06:18   Pertinent labs & imaging results that were available during my care of the patient were reviewed by me and considered in my medical decision making (see chart for details).  Medications Ordered in ED Medications  sodium chloride flush (NS) 0.9 % injection 3 mL (has no administration in time range)  alum & mag hydroxide-simeth (MAALOX/MYLANTA) 200-200-20 MG/5ML suspension 30 mL (30 mLs Oral Given 10/04/18 3235)                                                                                                                                    Procedures Procedures  (including critical care time)  Medical Decision Making / ED Course I have reviewed the nursing notes for this encounter and the patient's prior records (if available in EHR or on provided paperwork).    Patient presents with atypical chest pain most consistent with acid reflux versus esophageal spasms.  EKG without acute ischemic changes or evidence of pericarditis.  Initial troponin was negative.  Second troponin was negative.  This is sufficient to rule out ACS.  Low suspicion for pulmonary embolism.  Presentation not classic for aortic dissection or esophageal perforation.  Rest of the labs reassuring without leukocytosis or anemia.  No significant electrolyte derangements or renal sufficiency.  Chest x-ray without evidence suggestive of pneumonia, pneumothorax, pneumomediastinum.  No abnormal contour of the mediastinum to suggest dissection. No evidence of acute injuries.  Symptoms improve after GI cocktail.  Final Clinical Impression(s) / ED Diagnoses Final diagnoses:  Chest pain   Gastroesophageal reflux disease, esophagitis presence not specified   Disposition: Discharge  Condition: Good  I have discussed the results, Dx and Tx plan with the patient who expressed understanding and agree(s) with the plan. Discharge instructions discussed at great length. The patient was given strict return precautions who verbalized understanding of the instructions. No further questions at time of discharge.    ED Discharge Orders    None       Follow Up: Primary care provider  Schedule an appointment as soon as possible for a visit  As needed  This chart was dictated using voice recognition software.  Despite best efforts to proofread,  errors can occur which can change the documentation meaning.   Fatima Blank, MD 10/04/18 [phone]

## 2019-09-16 ENCOUNTER — Emergency Department (HOSPITAL_COMMUNITY)
Admission: EM | Admit: 2019-09-16 | Discharge: 2019-09-16 | Disposition: A | Discharge status: Home / Self Care | Payer: BLUE CROSS/BLUE SHIELD | Attending: Emergency Medicine | Admitting: Emergency Medicine

## 2019-09-16 ENCOUNTER — Other Ambulatory Visit: Payer: Self-pay

## 2019-09-16 ENCOUNTER — Encounter (HOSPITAL_COMMUNITY): Payer: Self-pay | Admitting: Emergency Medicine

## 2019-09-16 ENCOUNTER — Emergency Department (HOSPITAL_COMMUNITY): Payer: BLUE CROSS/BLUE SHIELD

## 2019-09-16 DIAGNOSIS — Y929 Unspecified place or not applicable: Secondary | ICD-10-CM | POA: Diagnosis not present

## 2019-09-16 DIAGNOSIS — W230XXA Caught, crushed, jammed, or pinched between moving objects, initial encounter: Secondary | ICD-10-CM | POA: Insufficient documentation

## 2019-09-16 DIAGNOSIS — S6010XA Contusion of unspecified finger with damage to nail, initial encounter: Secondary | ICD-10-CM | POA: Diagnosis not present

## 2019-09-16 DIAGNOSIS — S6991XA Unspecified injury of right wrist, hand and finger(s), initial encounter: Secondary | ICD-10-CM | POA: Diagnosis present

## 2019-09-16 DIAGNOSIS — Z79899 Other long term (current) drug therapy: Secondary | ICD-10-CM | POA: Insufficient documentation

## 2019-09-16 DIAGNOSIS — Y999 Unspecified external cause status: Secondary | ICD-10-CM | POA: Insufficient documentation

## 2019-09-16 DIAGNOSIS — Y939 Activity, unspecified: Secondary | ICD-10-CM | POA: Diagnosis not present

## 2019-09-16 NOTE — ED Provider Notes (Signed)
[facility] DEPT Provider Note   CSN: [id number] Arrival date & time: 09/16/19  1937     History Chief Complaint  Patient presents with  . Finger Injury    Jenny Obrien is a 54 y.o. female.  54 year old female who presents with finger injury.  Earlier today around 1 PM, she smashed her right middle finger tip in a garage door.  Initially she thought that it was going to be okay but the throbbing and pain has progressively worsened which is what prompted her to come in.  She is able to move all of her fingers and denies any other injuries.  The history is provided by the patient.       Past Medical History:  Diagnosis Date  . Abnormal Pap smear of cervix    yrs ago  . Migraine    cluster headaches  . Occipital neuralgia   . Sickle cell trait [facility])     Patient Active Problem List   Diagnosis Date Noted  . Dysphagia 09/16/2014  . Migraine, unspecified, without mention of intractable migraine without mention of status migrainosus 11/06/2012  . Occipital neuralgia     Past Surgical History:  Procedure Laterality Date  . CRYOTHERAPY     for abnormal paps yrs ago  . TUBAL LIGATION       OB History    Gravida  4   Para  4   Term  4   Preterm      AB      Living  3     SAB      TAB      Ectopic      Multiple      Live Births  3        Obstetric Comments  Daughter was murdered        Family History  Problem Relation Age of Onset  . Diabetes Mother   . Diverticulitis Mother   . Diabetes Sister   . Hypertension Sister   . Stroke Sister     Social History   Tobacco Use  . Smoking status: Never Smoker  . Smokeless tobacco: Never Used  Substance Use Topics  . Alcohol use: No    Alcohol/week: 0.0 standard drinks  . Drug use: No    Home Medications Prior to Admission medications   Medication Sig Start Date End Date Taking? Authorizing Provider  diazepam (VALIUM) 5 MG tablet Take 0.5 tablets (2.5 mg total)  by mouth every 6 (six) hours as needed for muscle spasms. Patient not taking: Reported on 10/04/2018 09/29/16   Mackuen, Courteney Lyn, MD  ibuprofen (ADVIL,MOTRIN) 800 MG tablet Take 1 tablet (800 mg total) by mouth 3 (three) times daily. Patient not taking: Reported on 10/04/2018 09/29/16   Mackuen, Courteney Lyn, MD  omeprazole (PRILOSEC) 20 MG capsule Take 1 capsule (20 mg total) by mouth daily. 01/30/18   Davonna Belling, MD  pantoprazole (PROTONIX) 40 MG tablet Take 40 mg by mouth daily.    [provider]  SUMAtriptan (IMITREX) 50 MG tablet Take 1 tablet (50 mg total) by mouth every 2 (two) hours as needed for migraine. May repeat in 2 hours if headache persists or recurs. 11/20/14   Daleen Bo, MD    Allergies    Patient has no known allergies.  Review of Systems   Review of Systems  Musculoskeletal: Positive for joint swelling.  Skin: Positive for color change.  Neurological: Negative for numbness.    Physical Exam  Updated Vital Signs BP (!) 155/84 (BP Location: Left Arm)   Pulse 90   Temp 98.6 F (37 C) (Oral)   Resp 18   Ht 5\' 5"  (1.651 m)   Wt 73.5 kg   SpO2 97%   BMI 26.96 kg/m   Physical Exam Vitals and nursing note reviewed.  Constitutional:      General: She is not in acute distress.    Appearance: She is well-developed.  HENT:     Head: Normocephalic and atraumatic.  Eyes:     Conjunctiva/sclera: Conjunctivae normal.  Musculoskeletal:        General: Swelling and tenderness present.     Cervical back: Neck supple.     Comments: R middle fingertip tender w/ mild swelling, subungual hematoma ~90% of nail; normal ROM at DIP and PIP joints  Skin:    General: Skin is warm and dry.  Neurological:     Mental Status: She is alert and oriented to person, place, and time.     Sensory: No sensory deficit.  Psychiatric:        Judgment: Judgment normal.     ED Results / Procedures / Treatments   Labs (all labs ordered are listed, but only abnormal  results are displayed) Labs Reviewed - No data to display  EKG None  Radiology DG Finger Middle Right  Result Date: 09/16/2019 CLINICAL DATA:  54 year old female status post crush injury to the tip of the right middle finger. EXAM: RIGHT MIDDLE FINGER 2+V COMPARISON:  Right wrist series 11/20/2014. FINDINGS: Bone mineralization is within normal limits. The right 3rd distal phalanx appears intact and normally aligned. The middle and proximal phalanges appear intact. Soft tissue swelling is evident. No soft tissue gas identified. Other visible osseous structures appear intact. IMPRESSION: No acute fracture or dislocation about the right 3rd finger. Electronically Signed   By: Genevie Ann M.D.   On: 09/16/2019 20:45    Procedures .Nail Removal  Date/Time: 09/16/2019 8:29 PM Performed by: Sharlett Iles, MD Authorized by: Sharlett Iles, MD   Consent:    Consent obtained:  Verbal   Consent given by:  Patient   Risks discussed:  Bleeding, pain and infection Location:    Hand:  R long finger Pre-procedure details:    Skin preparation:  Alcohol Anesthesia (see MAR for exact dosages):    Anesthesia method:  None Trephination:    Subungual hematoma drained: yes     Trephination instrument:  Cautery Post-procedure details:    Dressing:  4x4 sterile gauze   Patient tolerance of procedure:  Tolerated well, no immediate complications   (including critical care time)   Medications Ordered in ED Medications - No data to display  ED Course  I have reviewed the triage vital signs and the nursing notes.  Pertinent imaging results that were available during my care of the patient were reviewed by me and considered in my medical decision making (see chart for details).    MDM Rules/Calculators/A&P                      Drained hematoma at bedside. XR negative.   Discussed supportive measures and return precautions.  Final Clinical Impression(s) / ED Diagnoses Final  diagnoses:  Subungual hematoma of digit of hand, initial encounter    Rx / DC Orders ED Discharge Orders    None       Savannah Erbe, Wenda Overland, MD 09/16/19 2140

## 2019-11-07 ENCOUNTER — Encounter: Payer: Self-pay | Admitting: Certified Nurse Midwife

## 2021-02-08 ENCOUNTER — Other Ambulatory Visit: Payer: Self-pay

## 2021-02-08 ENCOUNTER — Emergency Department (HOSPITAL_COMMUNITY)
Admission: EM | Admit: 2021-02-08 | Discharge: 2021-02-08 | Disposition: A | Discharge status: Home / Self Care | Payer: BLUE CROSS/BLUE SHIELD | Attending: Emergency Medicine | Admitting: Emergency Medicine

## 2021-02-08 DIAGNOSIS — H53149 Visual discomfort, unspecified: Secondary | ICD-10-CM | POA: Insufficient documentation

## 2021-02-08 DIAGNOSIS — R519 Headache, unspecified: Secondary | ICD-10-CM | POA: Insufficient documentation

## 2021-02-08 DIAGNOSIS — G43009 Migraine without aura, not intractable, without status migrainosus: Secondary | ICD-10-CM

## 2021-02-08 MED ORDER — KETOROLAC TROMETHAMINE 60 MG/2ML IM SOLN
60.0000 mg | Freq: Once | INTRAMUSCULAR | Status: AC
  Administered 2021-02-08: 60 mg via INTRAMUSCULAR
  Filled 2021-02-08: qty 2

## 2021-02-08 MED ORDER — METOCLOPRAMIDE HCL 5 MG/ML IJ SOLN
10.0000 mg | Freq: Once | INTRAMUSCULAR | Status: AC
  Administered 2021-02-08: 10 mg via INTRAMUSCULAR
  Filled 2021-02-08: qty 2

## 2021-02-08 NOTE — ED Provider Notes (Signed)
[facility] DEPT Provider Note   CSN: [id number] Arrival date & time: 02/08/21  4268     History Chief Complaint  Patient presents with   Migraine    Jenny Obrien is a 55 y.o. female.  55 year old female with history of migraines presents with cute onset of headache similar to her migraine equivalent.  Pain is on the left side of her head and associated with photo as well as phonophobia.  Denies any fever or chills.  No neck pain.  No focal neurological findings.  Unsure of what precipitated this event.  Used Tylenol prior to arrival without improvement      Past Medical History:  Diagnosis Date   Abnormal Pap smear of cervix    yrs ago   Migraine    cluster headaches   Occipital neuralgia    Sickle cell trait [facility])     Patient Active Problem List   Diagnosis Date Noted   Dysphagia 09/16/2014   Migraine, unspecified, without mention of intractable migraine without mention of status migrainosus 11/06/2012   Occipital neuralgia     Past Surgical History:  Procedure Laterality Date   CRYOTHERAPY     for abnormal paps yrs ago   TUBAL LIGATION       OB History     Gravida  4   Para  4   Term  4   Preterm      AB      Living  3      SAB      IAB      Ectopic      Multiple      Live Births  3        Obstetric Comments  Daughter was murdered         Family History  Problem Relation Age of Onset   Diabetes Mother    Diverticulitis Mother    Diabetes Sister    Hypertension Sister    Stroke Sister     Social History   Tobacco Use   Smoking status: Never   Smokeless tobacco: Never  Vaping Use   Vaping Use: Never used  Substance Use Topics   Alcohol use: No    Alcohol/week: 0.0 standard drinks   Drug use: No    Home Medications Prior to Admission medications   Medication Sig Start Date End Date Taking? Authorizing Provider  diazepam (VALIUM) 5 MG tablet Take 0.5 tablets (2.5 mg total) by mouth  every 6 (six) hours as needed for muscle spasms. Patient not taking: Reported on 10/04/2018 09/29/16   Mackuen, Courteney Lyn, MD  ibuprofen (ADVIL,MOTRIN) 800 MG tablet Take 1 tablet (800 mg total) by mouth 3 (three) times daily. Patient not taking: Reported on 10/04/2018 09/29/16   Mackuen, Courteney Lyn, MD  omeprazole (PRILOSEC) 20 MG capsule Take 1 capsule (20 mg total) by mouth daily. 01/30/18   Davonna Belling, MD  pantoprazole (PROTONIX) 40 MG tablet Take 40 mg by mouth daily.    [provider]  SUMAtriptan (IMITREX) 50 MG tablet Take 1 tablet (50 mg total) by mouth every 2 (two) hours as needed for migraine. May repeat in 2 hours if headache persists or recurs. 11/20/14   Daleen Bo, MD    Allergies    Patient has no known allergies.  Review of Systems   Review of Systems  All other systems reviewed and are negative.  Physical Exam Updated Vital Signs BP 137/86 (BP Location: Left Arm)   Pulse  76   Temp 97.6 F (36.4 C) (Oral)   Resp 18   Ht 1.651 m (5\' 5" )   Wt 73.5 kg   SpO2 98%   BMI 26.96 kg/m   Physical Exam Vitals and nursing note reviewed.  Constitutional:      General: She is not in acute distress.    Appearance: Normal appearance. She is well-developed. She is not toxic-appearing.  HENT:     Head: Normocephalic and atraumatic.  Eyes:     General: Lids are normal.     Conjunctiva/sclera: Conjunctivae normal.     Pupils: Pupils are equal, round, and reactive to light.  Neck:     Thyroid: No thyroid mass.     Trachea: No tracheal deviation.  Cardiovascular:     Rate and Rhythm: Normal rate and regular rhythm.     Heart sounds: Normal heart sounds. No murmur heard.   No gallop.  Pulmonary:     Effort: Pulmonary effort is normal. No respiratory distress.     Breath sounds: Normal breath sounds. No stridor. No decreased breath sounds, wheezing, rhonchi or rales.  Abdominal:     General: There is no distension.     Palpations: Abdomen is soft.      Tenderness: There is no abdominal tenderness. There is no rebound.  Musculoskeletal:        General: No tenderness. Normal range of motion.     Cervical back: Normal range of motion and neck supple.  Skin:    General: Skin is warm and dry.     Findings: No abrasion or rash.  Neurological:     Mental Status: She is alert and oriented to person, place, and time. Mental status is at baseline.     GCS: GCS eye subscore is 4. GCS verbal subscore is 5. GCS motor subscore is 6.     Cranial Nerves: Cranial nerves are intact. No cranial nerve deficit.     Sensory: No sensory deficit.     Motor: Motor function is intact.  Psychiatric:        Attention and Perception: Attention normal.        Speech: Speech normal.        Behavior: Behavior normal.    ED Results / Procedures / Treatments   Labs (all labs ordered are listed, but only abnormal results are displayed) Labs Reviewed - No data to display  EKG None  Radiology No results found.  Procedures Procedures   Medications Ordered in ED Medications  metoCLOPramide (REGLAN) injection 10 mg (has no administration in time range)  ketorolac (TORADOL) injection 60 mg (has no administration in time range)    ED Course  I have reviewed the triage vital signs and the nursing notes.  Pertinent labs & imaging results that were available during my care of the patient were reviewed by me and considered in my medical decision making (see chart for details).    MDM Rules/Calculators/A&P                          Patient medicated for pain here and feels better.  Neurological exam stable at discharge Final Clinical Impression(s) / ED Diagnoses Final diagnoses:  None    Rx / DC Orders ED Discharge Orders     None        Lacretia Leigh, MD 02/08/21 1222

## 2021-02-08 NOTE — ED Triage Notes (Signed)
Patient states she has migraine starting at 6 am. Pain 8/10.

## 2021-10-05 ENCOUNTER — Other Ambulatory Visit: Payer: Self-pay

## 2021-10-05 ENCOUNTER — Encounter (HOSPITAL_BASED_OUTPATIENT_CLINIC_OR_DEPARTMENT_OTHER): Payer: Self-pay | Admitting: Emergency Medicine

## 2021-10-05 ENCOUNTER — Emergency Department (HOSPITAL_BASED_OUTPATIENT_CLINIC_OR_DEPARTMENT_OTHER): Payer: BLUE CROSS/BLUE SHIELD | Admitting: Radiology

## 2021-10-05 ENCOUNTER — Emergency Department (HOSPITAL_BASED_OUTPATIENT_CLINIC_OR_DEPARTMENT_OTHER)
Admission: EM | Admit: 2021-10-05 | Discharge: 2021-10-05 | Disposition: A | Discharge status: Home / Self Care | Payer: BLUE CROSS/BLUE SHIELD | Attending: Emergency Medicine | Admitting: Emergency Medicine

## 2021-10-05 DIAGNOSIS — J029 Acute pharyngitis, unspecified: Secondary | ICD-10-CM | POA: Diagnosis not present

## 2021-10-05 DIAGNOSIS — H6122 Impacted cerumen, left ear: Secondary | ICD-10-CM | POA: Diagnosis not present

## 2021-10-05 DIAGNOSIS — R051 Acute cough: Secondary | ICD-10-CM

## 2021-10-05 DIAGNOSIS — R059 Cough, unspecified: Secondary | ICD-10-CM | POA: Diagnosis present

## 2021-10-05 MED ORDER — CARBAMIDE PEROXIDE 6.5 % OT SOLN: 5.0000 [drp] | Freq: Once | OTIC | Status: DC

## 2021-10-05 MED ORDER — DOCUSATE SODIUM 50 MG/5ML PO LIQD
50.0000 mg | Freq: Once | ORAL | Status: AC
  Administered 2021-10-05: 50 mg via OTIC
  Filled 2021-10-05: qty 10

## 2021-10-05 MED ORDER — BENZONATATE 100 MG PO CAPS: 100.0000 mg | ORAL_CAPSULE | Freq: Three times a day (TID) | ORAL | 0 refills | Status: AC

## 2021-10-05 NOTE — Discharge Instructions (Addendum)
I have written you first medications to help with your cough.  Would also recommend follow-up with ear nose and throat provider for your impacted earwax.

## 2021-10-05 NOTE — ED Triage Notes (Signed)
Pt arrives to ED with c/o cough. This started x2 weeks ago. She states when the cough started, she had sore throat, cough, congestion. The cough has continued. The cough is dry. She does report that's x3 days ago her left hear has become clogged.

## 2021-10-05 NOTE — ED Provider Notes (Signed)
[facility] EMERGENCY DEPT Provider Note   CSN: [id number] Arrival date & time: 10/05/21  1813     History  Chief Complaint  Patient presents with   Cough    Jenny Obrien is a 56 y.o. female for evaluation of cough.  Started 2 weeks ago.  Initially had sore throat, congestion when cough started.  The rest of her symptoms improved however she still has a dry cough.  No chest pain or shortness of breath.  Also reported 2 days ago left ear became muffled.  No pain, drainage.  She tried using earwax removal without relief.  No pain behind the ear.  No fever, emesis, recent swimming activities  HPI     Home Medications Prior to Admission medications   Medication Sig Start Date End Date Taking? Authorizing Provider  benzonatate (TESSALON) 100 MG capsule Take 1 capsule (100 mg total) by mouth every 8 (eight) hours. 10/05/21  Yes Shanetha Bradham A, PA-C  omeprazole (PRILOSEC) 20 MG capsule Take 1 capsule (20 mg total) by mouth daily. 01/30/18   Davonna Belling, MD  pantoprazole (PROTONIX) 40 MG tablet Take 40 mg by mouth daily.    [provider]  SUMAtriptan (IMITREX) 50 MG tablet Take 1 tablet (50 mg total) by mouth every 2 (two) hours as needed for migraine. May repeat in 2 hours if headache persists or recurs. 11/20/14   Daleen Bo, MD      Allergies    Patient has no known allergies.    Review of Systems   Review of Systems  Constitutional: Negative.   HENT:  Positive for ear pain. Negative for congestion, dental problem, drooling, ear discharge, rhinorrhea, sinus pressure, sinus pain, sneezing, sore throat, tinnitus, trouble swallowing and voice change.   Respiratory:  Positive for cough. Negative for apnea, choking, chest tightness, shortness of breath, wheezing and stridor.   Cardiovascular: Negative.   Gastrointestinal: Negative.   Genitourinary: Negative.   Musculoskeletal: Negative.   Skin: Negative.   Neurological: Negative.   All other systems  reviewed and are negative.  Physical Exam Updated Vital Signs BP 124/75 (BP Location: Right Arm)    Pulse 83    Temp 98 F (36.7 C) (Oral)    Resp 18    Ht 5\' 5"  (1.651 m)    Wt 77.1 kg    SpO2 97%    BMI 28.29 kg/m  Physical Exam Vitals and nursing note reviewed.  Constitutional:      General: She is not in acute distress.    Appearance: She is well-developed. She is not ill-appearing, toxic-appearing or diaphoretic.  HENT:     Head: Atraumatic.     Right Ear: Tympanic membrane, ear canal and external ear normal. There is no impacted cerumen.     Left Ear: There is impacted cerumen.     Ears:     Comments: Impacted cerumen left ear.  Right TM clear, nontender bilateral mastoid.  No drainage bilateral ears    Nose: Nose normal. No congestion or rhinorrhea.     Mouth/Throat:     Mouth: Mucous membranes are moist.  Eyes:     Pupils: Pupils are equal, round, and reactive to light.  Cardiovascular:     Rate and Rhythm: Normal rate.     Pulses: Normal pulses.     Heart sounds: Normal heart sounds.  Pulmonary:     Effort: Pulmonary effort is normal. No respiratory distress.     Breath sounds: Normal breath sounds.  Comments: Clear bilaterally, speaks in full sentences without difficulty Abdominal:     General: Bowel sounds are normal. There is no distension.     Palpations: Abdomen is soft.  Musculoskeletal:        General: No swelling or tenderness. Normal range of motion.     Cervical back: Normal range of motion.  Skin:    General: Skin is warm and dry.     Capillary Refill: Capillary refill takes less than 2 seconds.  Neurological:     General: No focal deficit present.     Mental Status: She is alert and oriented to person, place, and time.  Psychiatric:        Mood and Affect: Mood normal.    ED Results / Procedures / Treatments   Labs (all labs ordered are listed, but only abnormal results are displayed) Labs Reviewed - No data to  display  EKG None  Radiology DG Chest 2 View  Result Date: 10/05/2021 CLINICAL DATA:  Cough, congestion, sore throat EXAM: CHEST - 2 VIEW COMPARISON:  10/04/2018 FINDINGS: The heart size and mediastinal contours are within normal limits. Both lungs are clear. The visualized skeletal structures are unremarkable. IMPRESSION: No active cardiopulmonary disease. Electronically Signed   By: Jerilynn Mages.  Shick M.D.   On: 10/05/2021 19:02    Procedures .Ear Cerumen Removal  Date/Time: 10/05/2021 9:13 PM Performed by: Nettie Elm, PA-C Authorized by: Nettie Elm, PA-C   Consent:    Consent obtained:  Verbal   Consent given by:  Patient   Risks, benefits, and alternatives were discussed: yes     Risks discussed:  Pain, TM perforation, dizziness, infection, incomplete removal and bleeding   Alternatives discussed:  No treatment, delayed treatment, referral, observation and alternative treatment Universal protocol:    Procedure explained and questions answered to patient or proxy's satisfaction: yes     Relevant documents present and verified: yes     Test results available: yes     Imaging studies available: yes     Required blood products, implants, devices, and special equipment available: yes     Site/side marked: yes     Immediately prior to procedure, a time out was called: yes     Patient identity confirmed:  Verbally with patient Procedure details:    Location:  L ear   Procedure type: irrigation     Procedure outcomes: unable to remove cerumen   Post-procedure details:    Inspection:  No bleeding   Hearing quality:  Improved   Procedure completion:  Tolerated well, no immediate complications    Medications Ordered in ED Medications  docusate (COLACE) 50 MG/5ML liquid 50 mg (50 mg Left EAR Given 10/05/21 2029)    ED Course/ Medical Decision Making/ A&P    55 with here for evaluation of cough over the last 2 weeks.  Initially associated with congestion, rhinorrhea,  sore throat.  Cough persisted, nonproductive.  Rest of symptoms resolved.  She has no chest pain, shortness of breath.  She has no clinical evidence of VTE.  Wells criteria low risk.  Also admits to 2 days of left ear fullness.  Try using cerumen remover without relief.  She has no evidence of mastoiditis bilaterally.  Right TM clear.  Left ear with cerumen impaction.  No drainage bilaterally.  Chest x-ray personally viewed and interpreted No cardiomegaly, pneumothorax, edema, infiltrates  Patient reassessed did have some cerumen removed however still impacted.  Discussed follow-up with ENT for this.  With  regards to her cough this is likely postviral.  Low suspicion for acute ACS, PE, dissection, bacterial infectious process.  Will treat symptomatically.  We will have her follow-up with her PCP.  The patient has been appropriately medically screened and/or stabilized in the ED. I have low suspicion for any other emergent medical condition which would require further screening, evaluation or treatment in the ED or require inpatient management.  Patient is hemodynamically stable and in no acute distress.  Patient able to ambulate in department prior to ED.  Evaluation does not show acute pathology that would require ongoing or additional emergent interventions while in the emergency department or further inpatient treatment.  I have discussed the diagnosis with the patient and answered all questions.  Pain is been managed while in the emergency department and patient has no further complaints prior to discharge.  Patient is comfortable with plan discussed in room and is stable for discharge at this time.  I have discussed strict return precautions for returning to the emergency department.  Patient was encouraged to follow-up with PCP/specialist refer to at discharge.                            Medical Decision Making Amount and/or Complexity of Data Reviewed Radiology: ordered and independent  interpretation performed. Decision-making details documented in ED Course.  Risk OTC drugs. Prescription drug management. Risk Details: Do not feel she needs additional labs, imaging, hospitalization at this time          Final Clinical Impression(s) / ED Diagnoses Final diagnoses:  Acute cough  Impacted cerumen of left ear    Rx / DC Orders ED Discharge Orders          Ordered    benzonatate (TESSALON) 100 MG capsule  Every 8 hours        10/05/21 2112              Shakeera Rightmyer A, PA-C 10/05/21 2115    Varney Biles, MD 10/10/21 1514
